# Patient Record
Sex: Female | Born: 1965 | Race: White | Hispanic: No | Marital: Married | Smoking: Former smoker
Health system: Southern US, Community
[De-identification: ages and names within clinical notes are randomized; demographics above are authoritative.]

## PROBLEM LIST (undated history)

## (undated) DIAGNOSIS — G43109 Migraine with aura, not intractable, without status migrainosus: Secondary | ICD-10-CM

## (undated) DIAGNOSIS — N200 Calculus of kidney: Secondary | ICD-10-CM

## (undated) DIAGNOSIS — H269 Unspecified cataract: Secondary | ICD-10-CM

## (undated) DIAGNOSIS — G43009 Migraine without aura, not intractable, without status migrainosus: Secondary | ICD-10-CM

## (undated) DIAGNOSIS — G43909 Migraine, unspecified, not intractable, without status migrainosus: Secondary | ICD-10-CM

## (undated) DIAGNOSIS — D496 Neoplasm of unspecified behavior of brain: Secondary | ICD-10-CM

## (undated) DIAGNOSIS — H9190 Unspecified hearing loss, unspecified ear: Secondary | ICD-10-CM

## (undated) HISTORY — DX: Calculus of kidney: N20.0

## (undated) HISTORY — DX: Unspecified cataract: H26.9

## (undated) HISTORY — DX: Migraine, unspecified, not intractable, without status migrainosus: G43.909

## (undated) HISTORY — DX: Migraine without aura, not intractable, without status migrainosus: G43.009

## (undated) HISTORY — DX: Neoplasm of unspecified behavior of brain: D49.6

## (undated) HISTORY — DX: Migraine with aura, not intractable, without status migrainosus: G43.109

## (undated) HISTORY — PX: OTHER SURGICAL HISTORY: SHX169

## (undated) HISTORY — DX: Unspecified hearing loss, unspecified ear: H91.90

---

## 2001-11-14 HISTORY — PX: ACOUSTIC NEUROMA RESECTION: SHX5713

## 2011-06-21 ENCOUNTER — Other Ambulatory Visit: Payer: Self-pay | Admitting: *Deleted

## 2011-06-21 ENCOUNTER — Other Ambulatory Visit: Payer: Self-pay

## 2011-06-21 ENCOUNTER — Ambulatory Visit
Admission: RE | Admit: 2011-06-21 | Discharge: 2011-06-21 | Disposition: A | Discharge status: Home / Self Care | Payer: BC Managed Care – PPO | Source: Ambulatory Visit | Attending: *Deleted | Admitting: *Deleted

## 2011-06-21 ENCOUNTER — Ambulatory Visit
Admission: RE | Admit: 2011-06-21 | Discharge: 2011-06-21 | Disposition: A | Discharge status: Home / Self Care | Payer: BC Managed Care – PPO | Source: Ambulatory Visit | Attending: Family Medicine | Admitting: Family Medicine

## 2011-06-21 DIAGNOSIS — R87619 Unspecified abnormal cytological findings in specimens from cervix uteri: Secondary | ICD-10-CM

## 2011-07-26 ENCOUNTER — Other Ambulatory Visit: Payer: Self-pay | Admitting: Obstetrics and Gynecology

## 2011-08-05 ENCOUNTER — Ambulatory Visit
Admission: RE | Admit: 2011-08-05 | Discharge: 2011-08-05 | Disposition: A | Discharge status: Home / Self Care | Payer: BC Managed Care – PPO | Source: Ambulatory Visit | Attending: Obstetrics and Gynecology | Admitting: Obstetrics and Gynecology

## 2011-08-05 ENCOUNTER — Other Ambulatory Visit: Payer: Self-pay | Admitting: Obstetrics and Gynecology

## 2011-08-05 DIAGNOSIS — N83209 Unspecified ovarian cyst, unspecified side: Secondary | ICD-10-CM

## 2011-12-03 IMAGING — US US TRANSVAGINAL NON-OB
1 series · 13 of 25 positions shown · non-contrast
Comparison: None.

CLINICAL DATA: Abnormal Pap seen, history of endometriosis

TRANSABDOMINAL ULTRASOUND OF PELVIS
TECHNIQUE: Transabdominal ultrasound examination of the pelvis was
performed including evaluation of the uterus, ovaries, adnexal
regions, and pelvic cul-de-sac.

[Series 1: us transvaginal non-ob · 0.28mm/px · 13 of 52 slices shown]
[im 1/52]
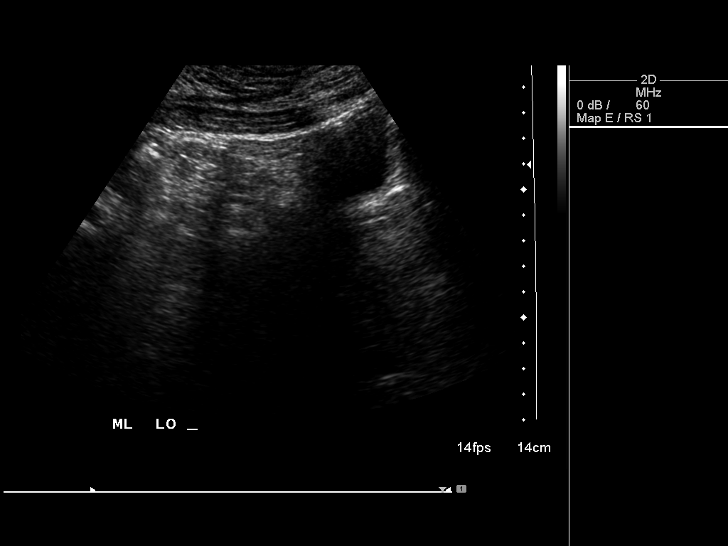
[im 5/52]
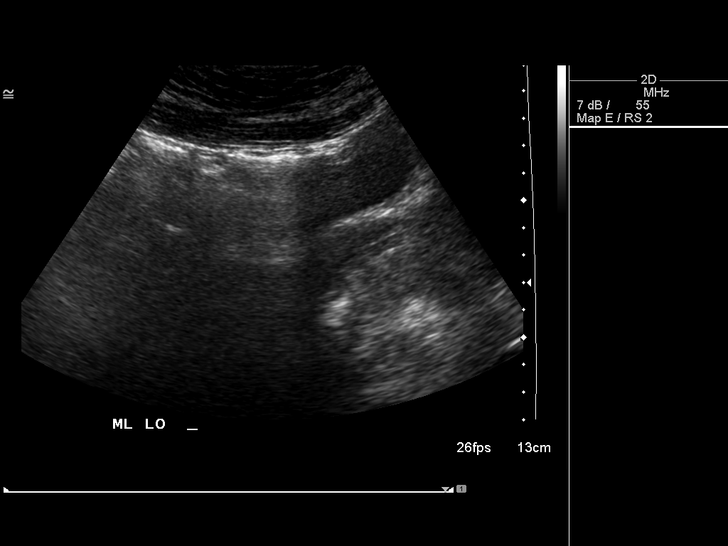
[im 9/52]
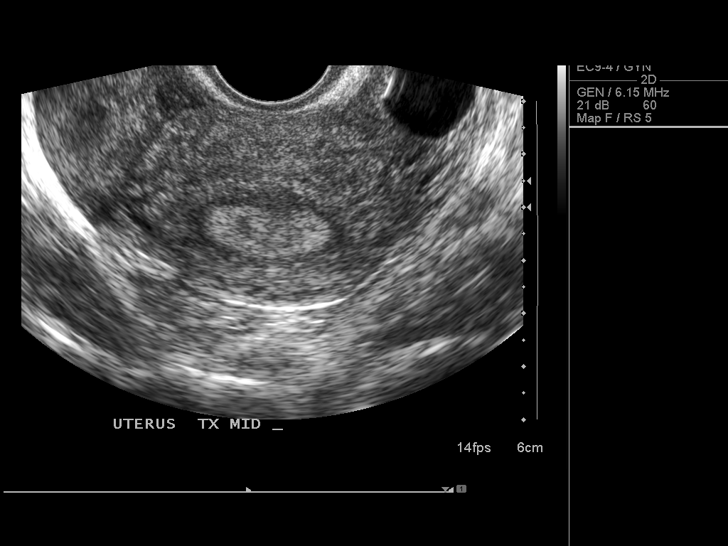
[im 13/52]
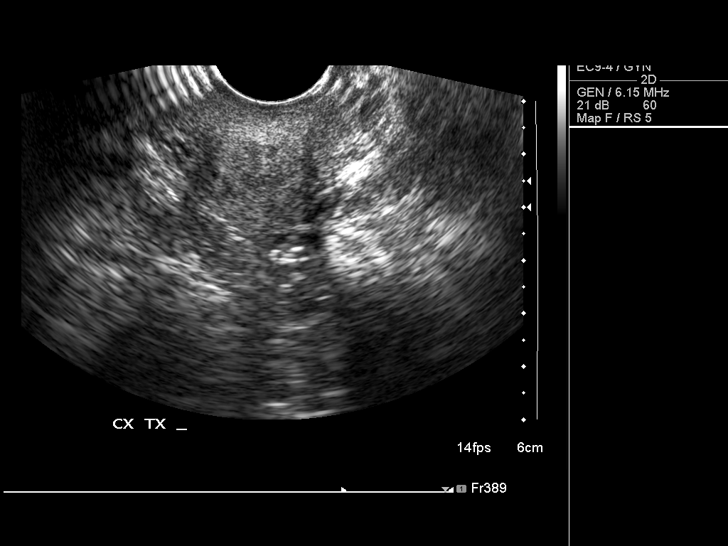
[im 18/52]
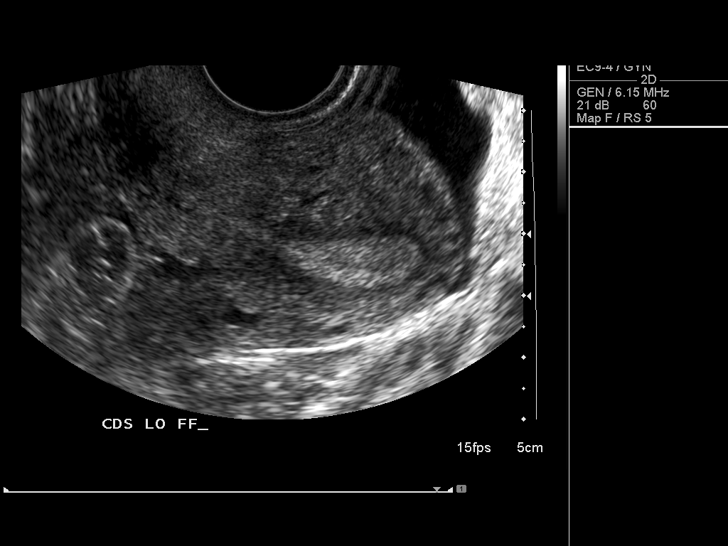
[im 22/52]
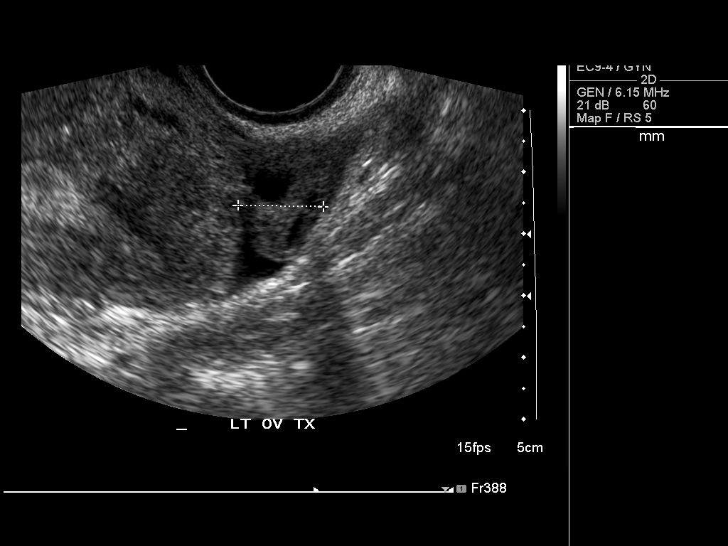
[im 26/52]
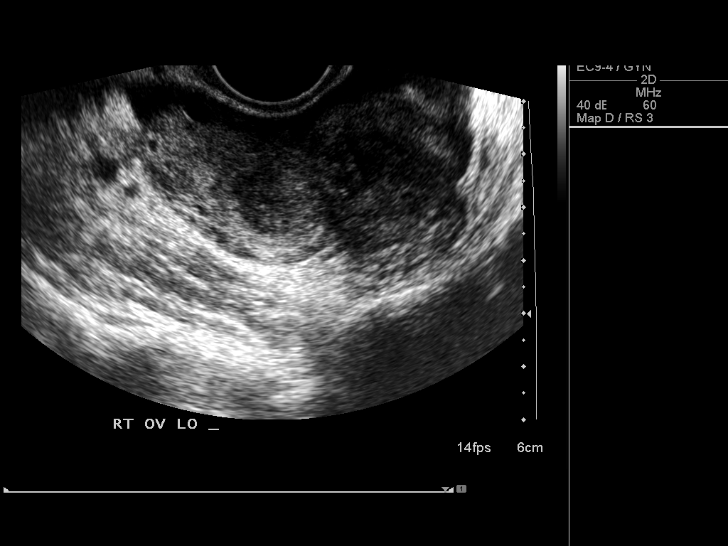
[im 30/52]
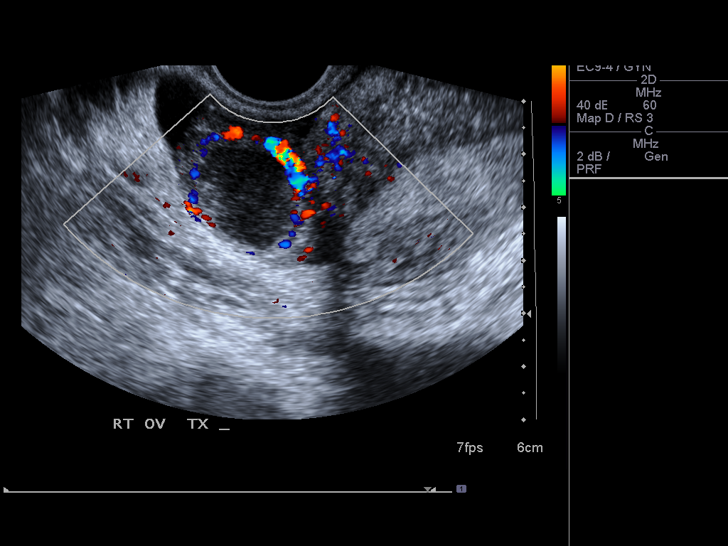
[im 35/52]
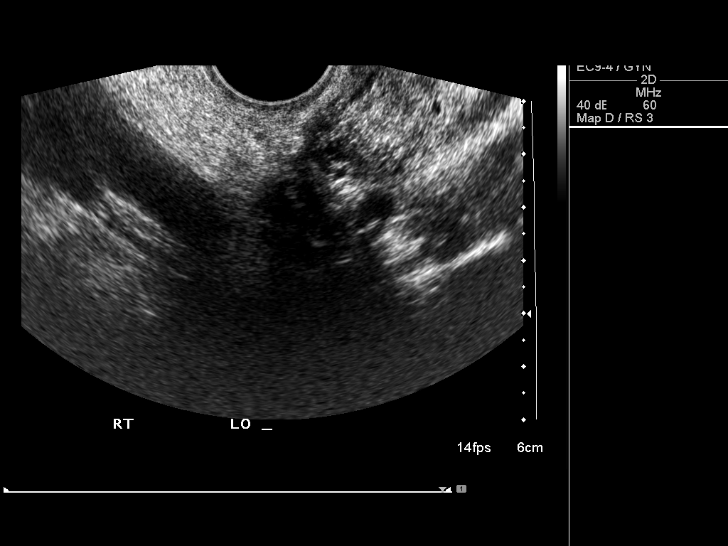
[im 39/52]
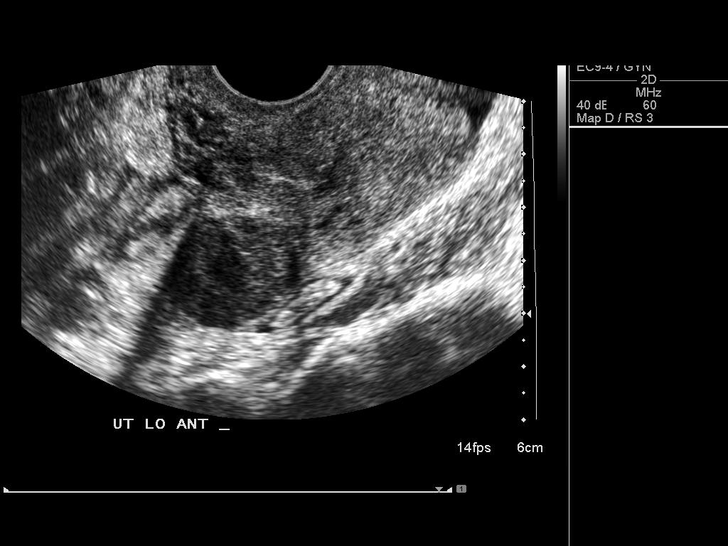
[im 43/52]
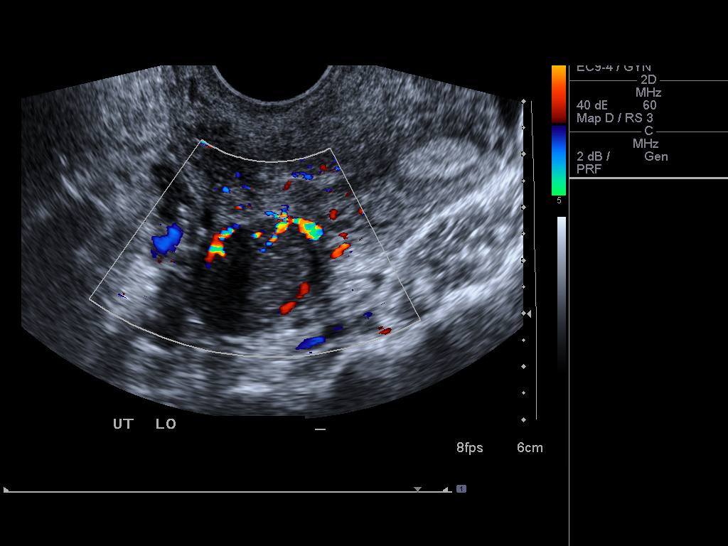
[im 47/52]
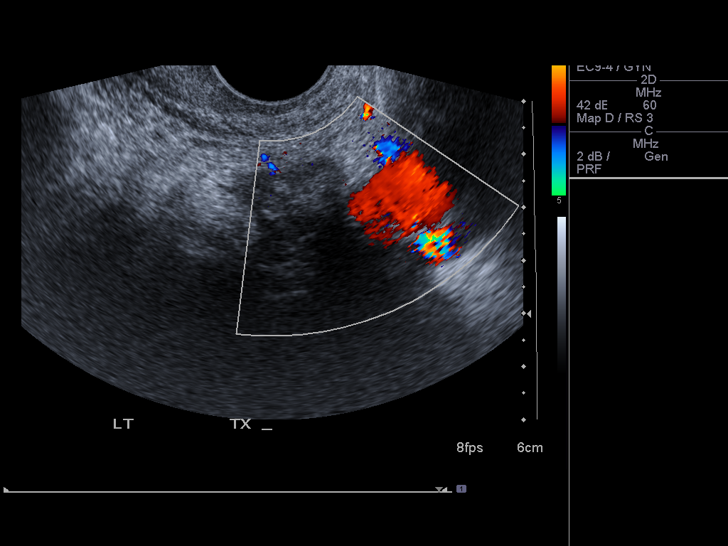
[im 52/52]
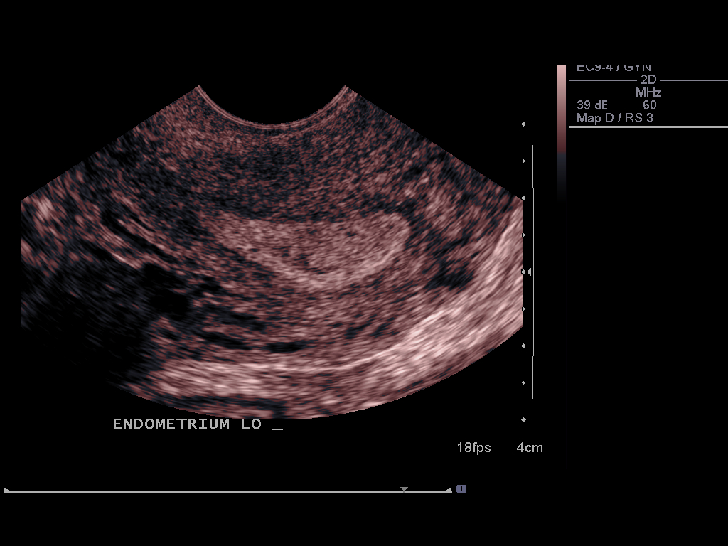

[13 of 25 positions shown; findings below may reference images not displayed]

FINDINGS: Uterus:  The uterus is retroflexed measuring 7.2 x 6.1 x 5.2 cm.
Along the anterior margin of the uterine fundus there is an
exophytic rounded lesion measuring 2.5 x 2.2 x 2.3 cm.  This is
most suggestive of a leiomyoma.

Endometrium: Endometrium is within normal limits for premenopausal
female at 8 mm.

Right ovary: Right ovary measures 4.0 x 2.2 x 2.4 cm.  There is a
round homogeneously hypoechoic lesion in the right ovary measuring
2.2 x 1.9 x 1.8 cm.

Left ovary: Left ovary measures 3.7 x 2.1 by 1.4 cm.  There is an
anechoic 2 cm cyst within the left ovary.

Other Findings:  The small amount free fluid the cul-de-sac which
appears simple.
IMPRESSION: 1 . Serosal uterine fibroid.
2.  Rounded lesion in the left ovary likely represents an
endometrioma.  Recommend follow-up ultrasound in 6 to 12 weeks for
reevaluation.

 3.  Anechoic cyst in left ovary likely represents a functional
ovarian cyst.
4.   Small free fluid within the cul-de-sac may be physiologic.

## 2012-01-17 IMAGING — US US TRANSVAGINAL NON-OB
1 series · 13 of 25 positions shown · non-contrast
Comparison: 06/21/2011

CLINICAL DATA: Right ovarian hemorrhagic lesion

TRANSVAGINAL ULTRASOUND OF PELVIS
DOPPLER ULTRASOUND OF OVARIES
TECHNIQUE: Transvaginal ultrasound examination of the pelvis was
performed including evaluation of the uterus, ovaries, adnexal
regions, and pelvic cul-de-sac. Color and duplex Doppler ultrasound
was utilized to evaluate blood flow to the ovaries.

[Series 1: us transvaginal non-ob · 0.13mm/px · 13 of 57 slices shown]
[im 1/57]
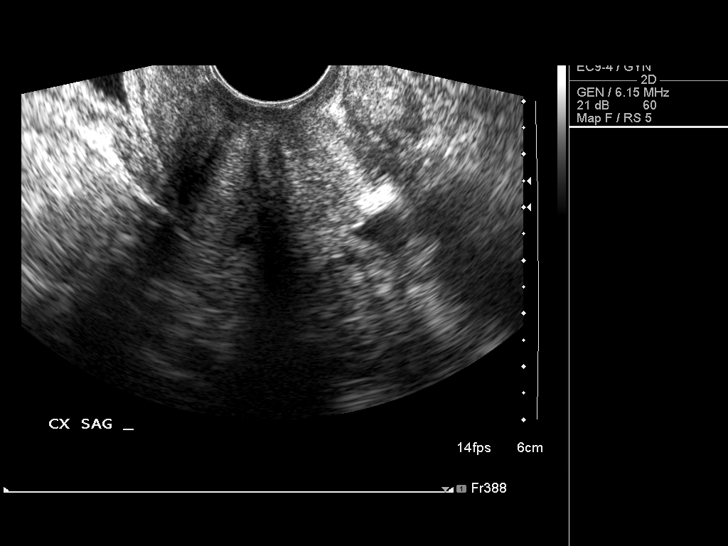
[im 5/57]
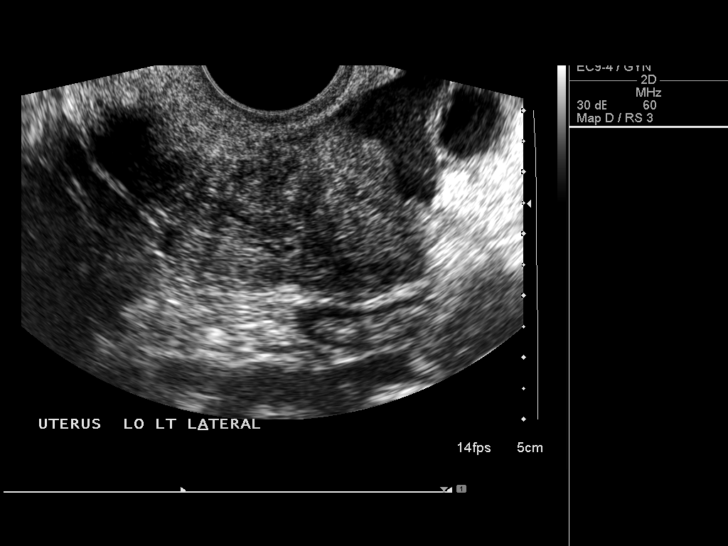
[im 10/57]
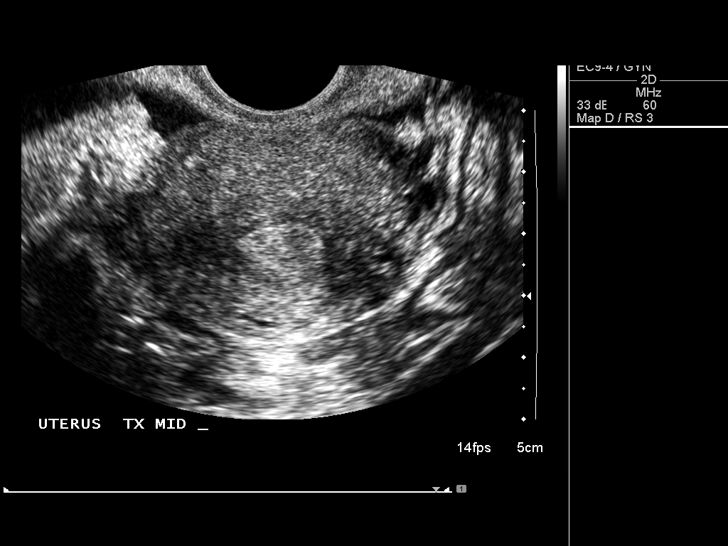
[im 15/57]
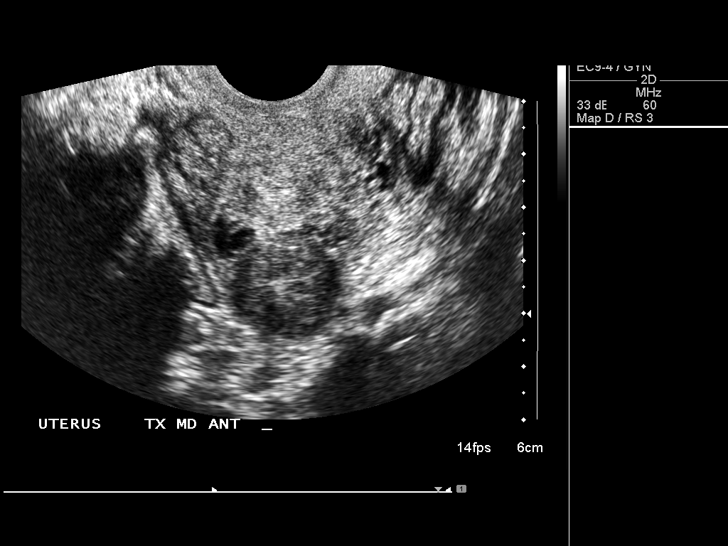
[im 19/57]
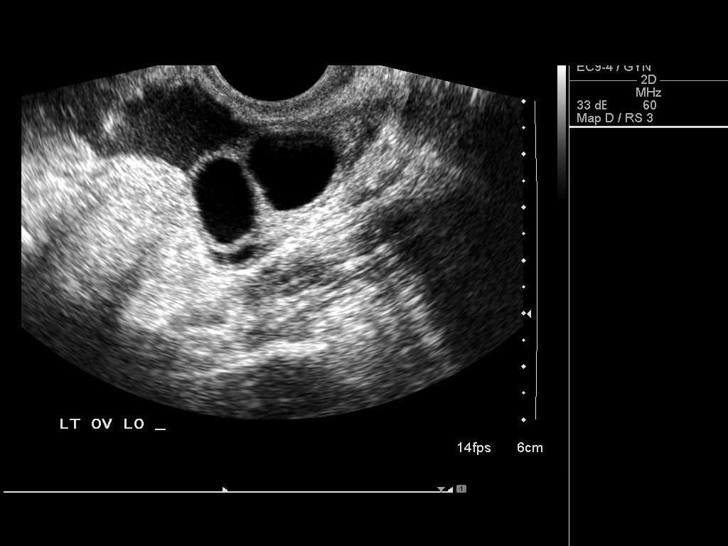
[im 24/57]
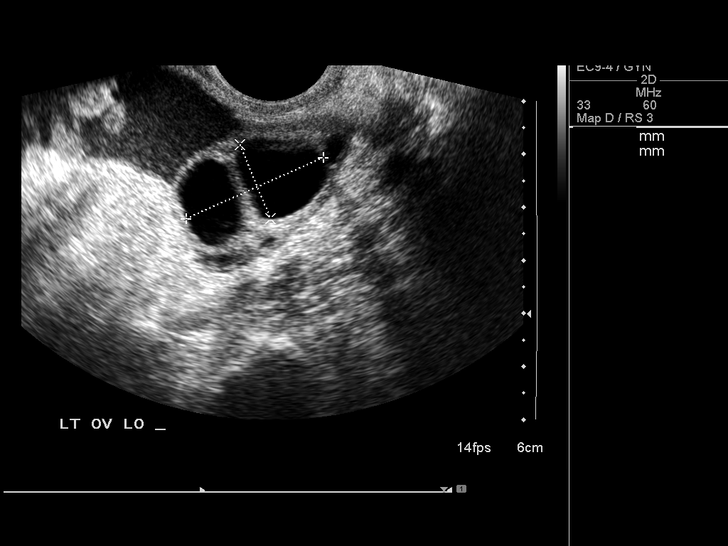
[im 29/57]
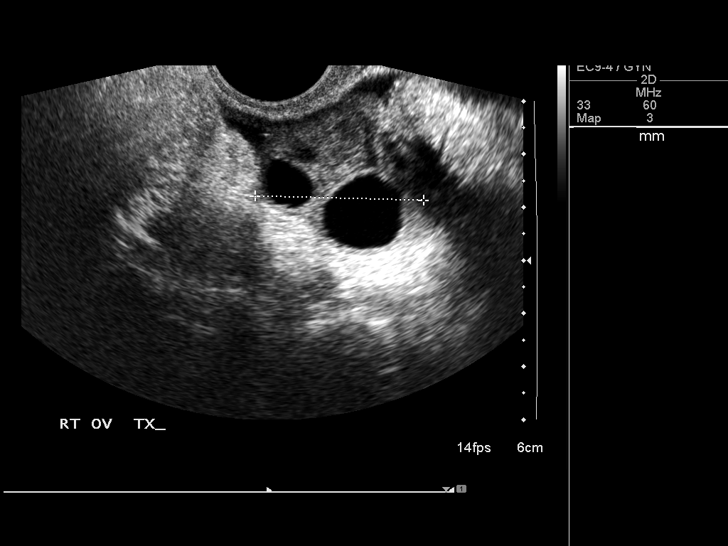
[im 33/57]
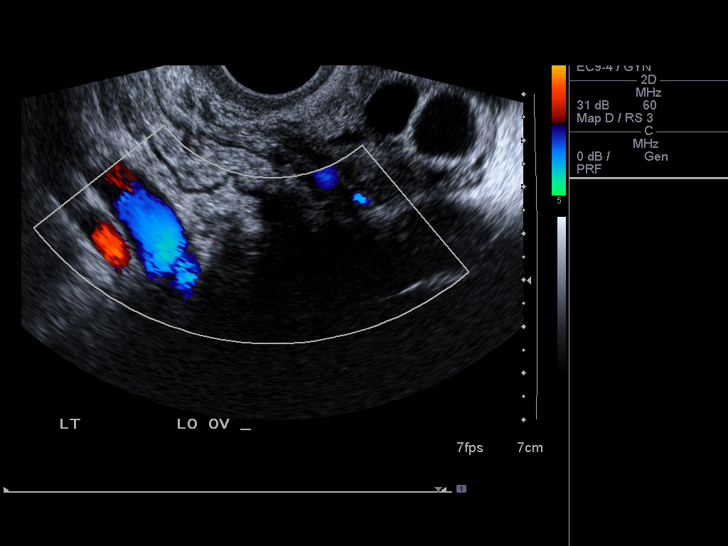
[im 38/57]
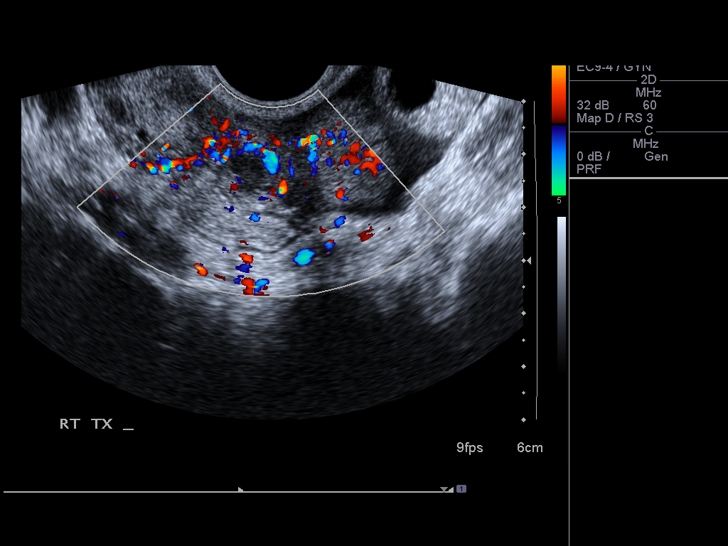
[im 43/57]
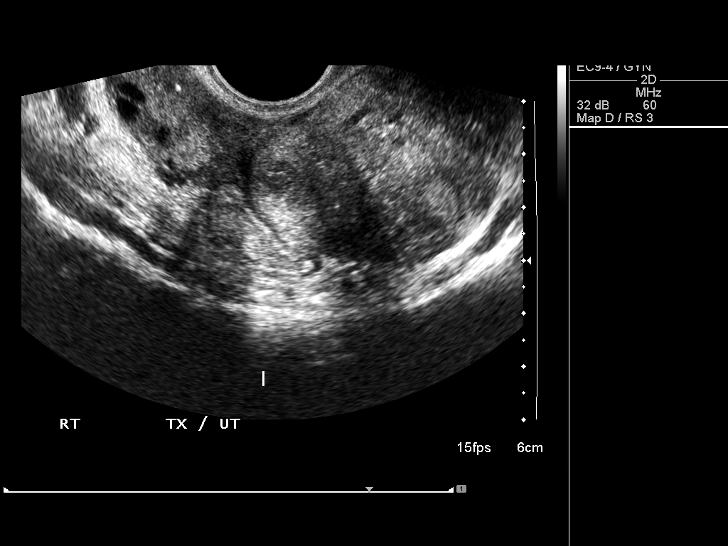
[im 47/57]
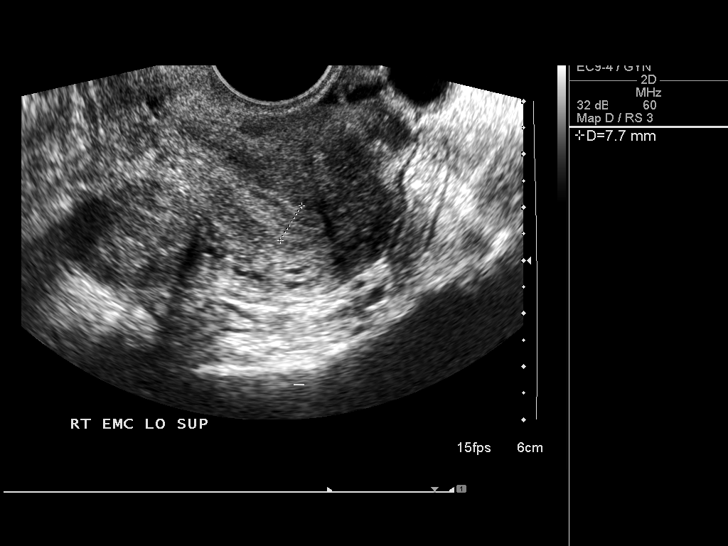
[im 52/57]
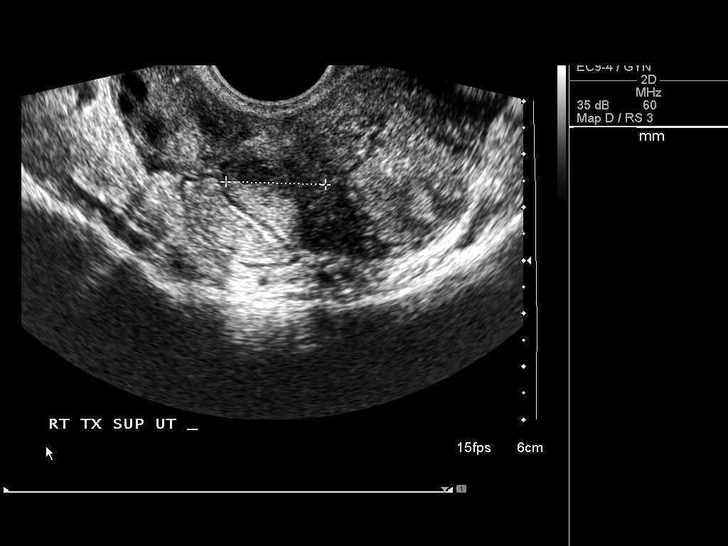
[im 57/57]
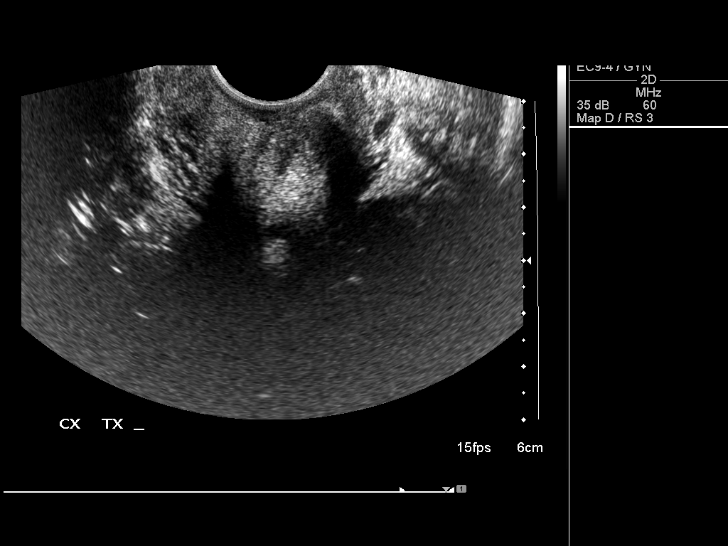

[13 of 25 positions shown; findings below may reference images not displayed]

FINDINGS: Uterus:  7.2 x 5.5 x 4.6 centimeter.  2.5 cm anterior subserosal
fibroid is again identified.

Endometrium: 6-7 mm in thickness.

Right ovary: 3.6 x 1.9 x 3.2 cm.  Several follicles are seen within
the parenchyma there is some adjacent fluid containing low level
echoes.

Left ovary: 3.6 x 2.2 x 2.4 cm.  Follicles measure up to 17 mm in
diameter.

Other Findings:  There is some complex fluid in the peritoneal
cavity to a small degree.

Complex area is seen along the right uterine fundus.  This could be
related to bowel but on some images appears to be a thick-walled
cystic lesion.
IMPRESSION: Both ovaries are identified on today's study without
an ovarian mass.

Ill-defined complex process along the right uterine fundus of
indeterminate etiology.  There may be some small bowel loops in
this area complicating evaluation.  The patient does have some
complex free fluid in the pelvis.  On some of the images, the
process in the right pelvis appears to contain a thick-walled cyst,
and correlation for pregnancy is recommended to exclude an ectopic
pregnancy.  Repeat ultrasound could be used to assess evolution of
this finding or MRI of the pelvis without and with contrast may
prove helpful to further evaluate.

.

## 2012-07-13 ENCOUNTER — Other Ambulatory Visit: Payer: Self-pay | Admitting: *Deleted

## 2012-07-13 DIAGNOSIS — R87619 Unspecified abnormal cytological findings in specimens from cervix uteri: Secondary | ICD-10-CM

## 2012-07-17 ENCOUNTER — Ambulatory Visit (INDEPENDENT_AMBULATORY_CARE_PROVIDER_SITE_OTHER): Payer: BC Managed Care – PPO

## 2012-07-17 ENCOUNTER — Other Ambulatory Visit: Payer: Self-pay | Admitting: Obstetrics and Gynecology

## 2012-07-17 DIAGNOSIS — D252 Subserosal leiomyoma of uterus: Secondary | ICD-10-CM

## 2012-07-17 DIAGNOSIS — R87619 Unspecified abnormal cytological findings in specimens from cervix uteri: Secondary | ICD-10-CM

## 2012-12-29 IMAGING — US US PELVIS COMPLETE
1 series · 13 of 25 positions shown · non-contrast
Comparison: 08/05/2011 and prior MRI report performed at [REDACTED] Department on 08/08/2011

CLINICAL DATA: Abnormal Pap smear. LMP 07/12/2012.



[Series 1: us pelvis complete · 0.28mm/px · 13 of 46 slices shown]
[im 1/46]
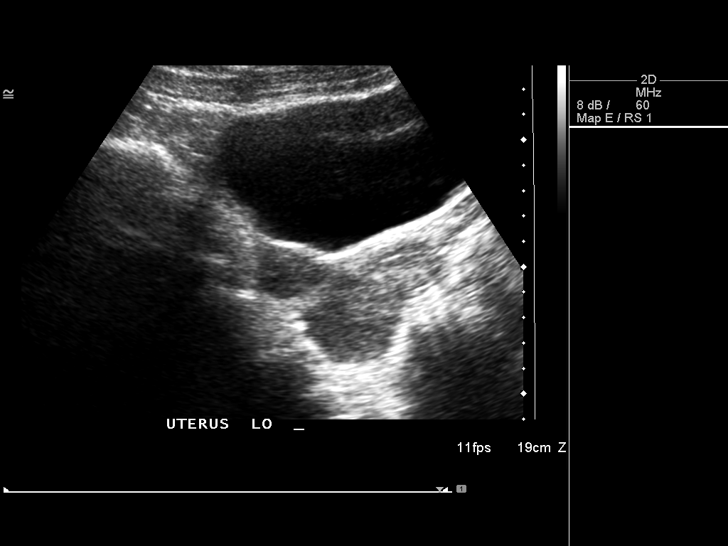
[im 4/46]
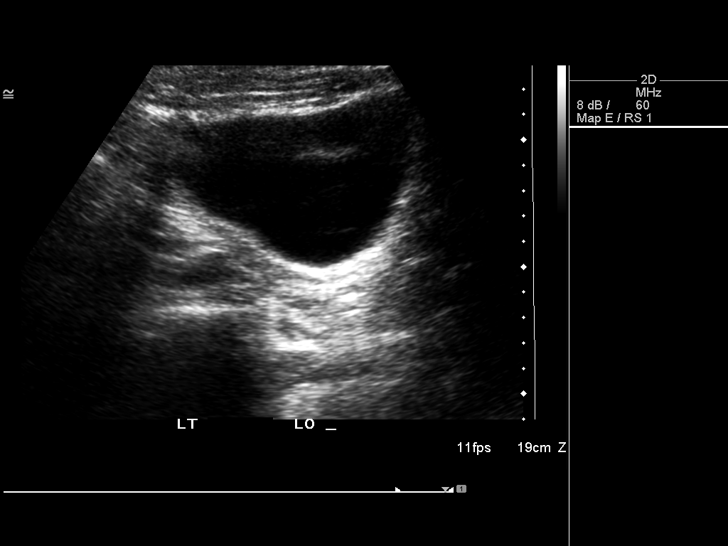
[im 8/46]
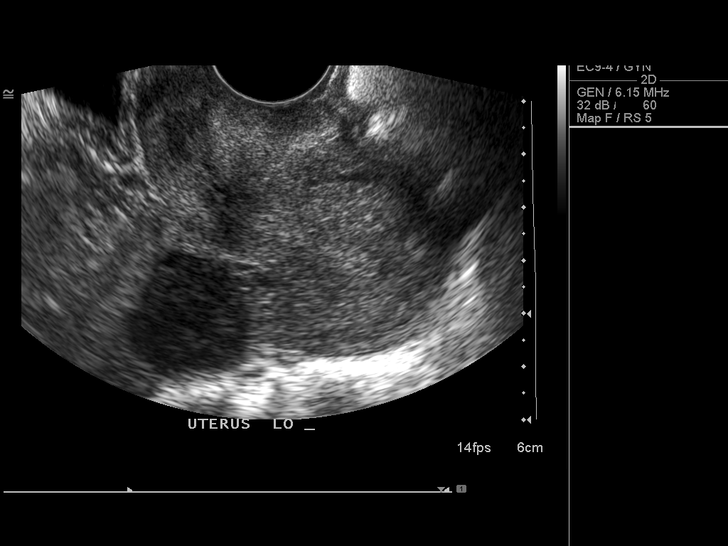
[im 12/46]
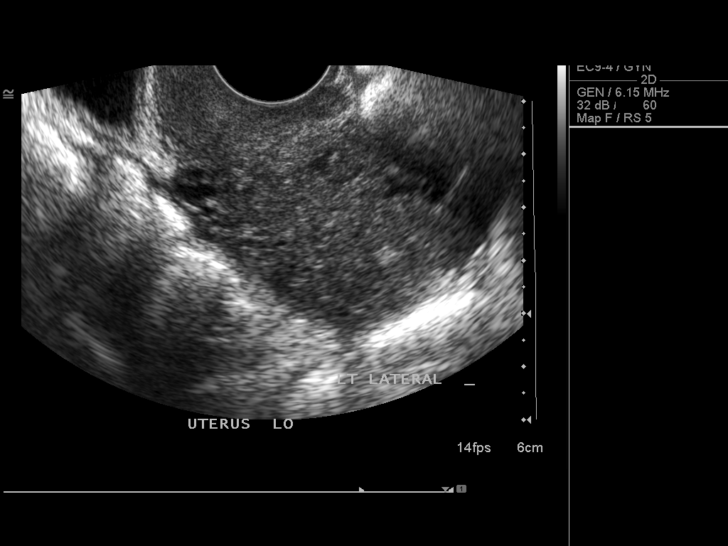
[im 16/46]
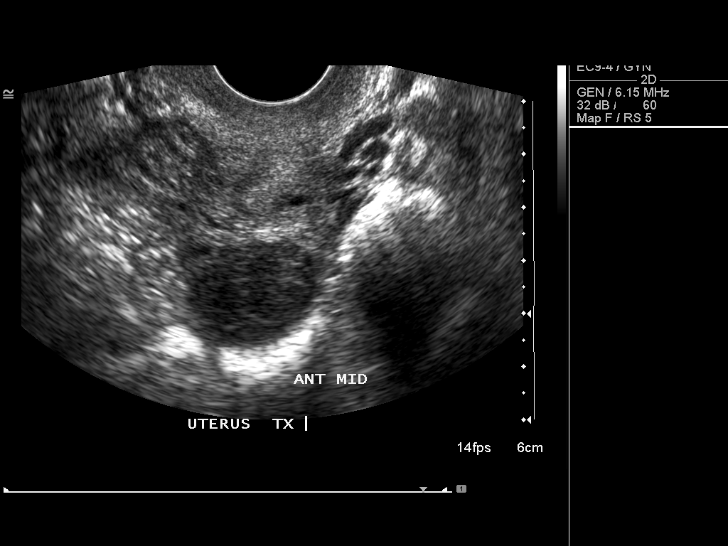
[im 19/46]
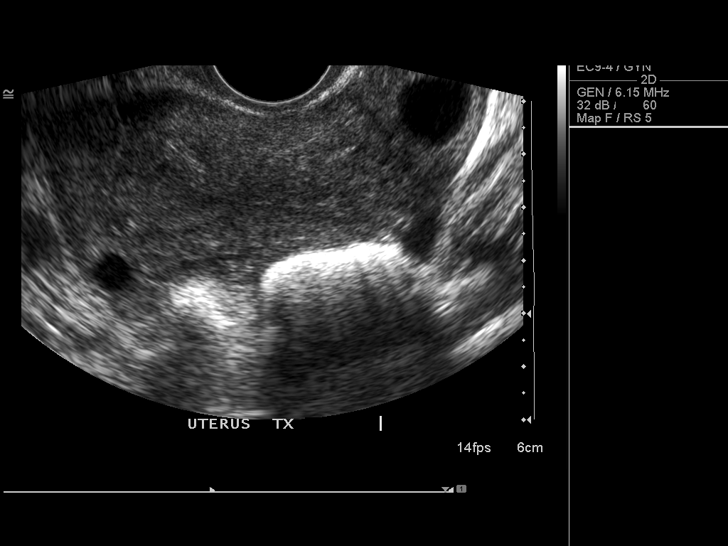
[im 23/46]
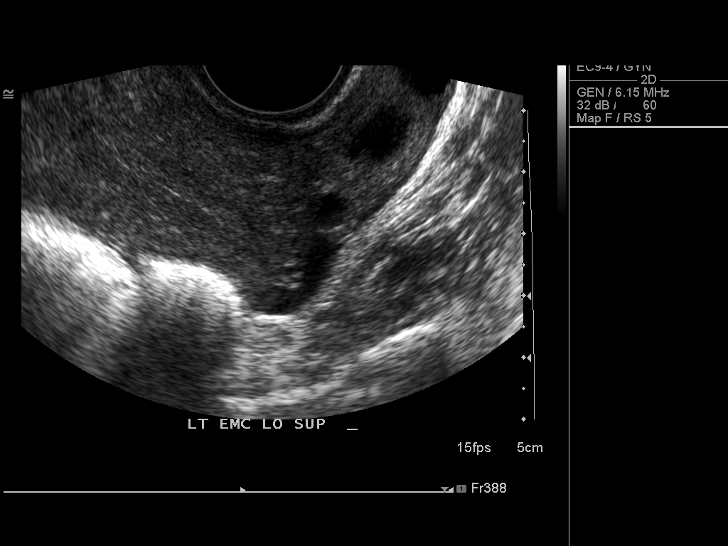
[im 27/46]
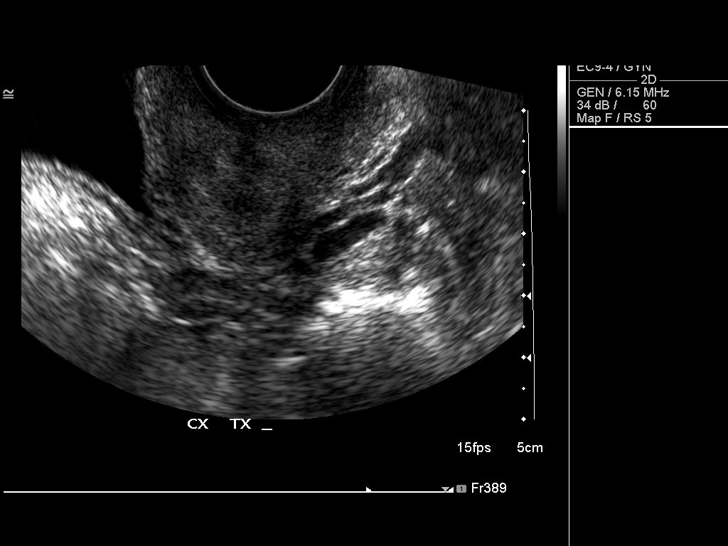
[im 31/46]
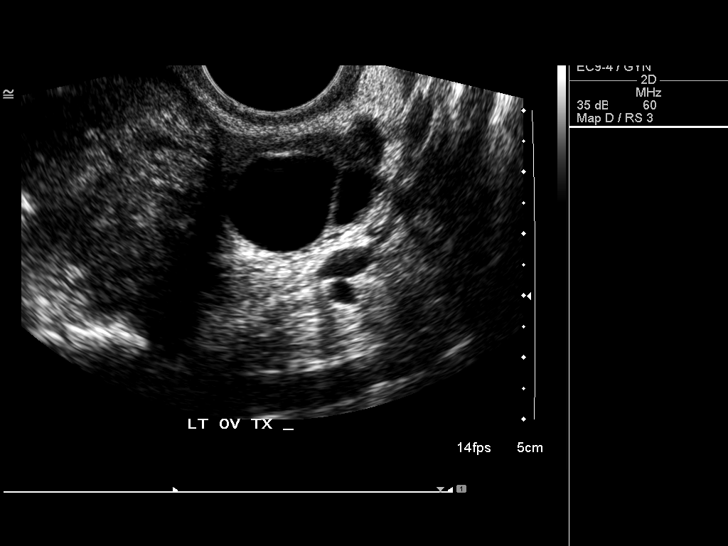
[im 34/46]
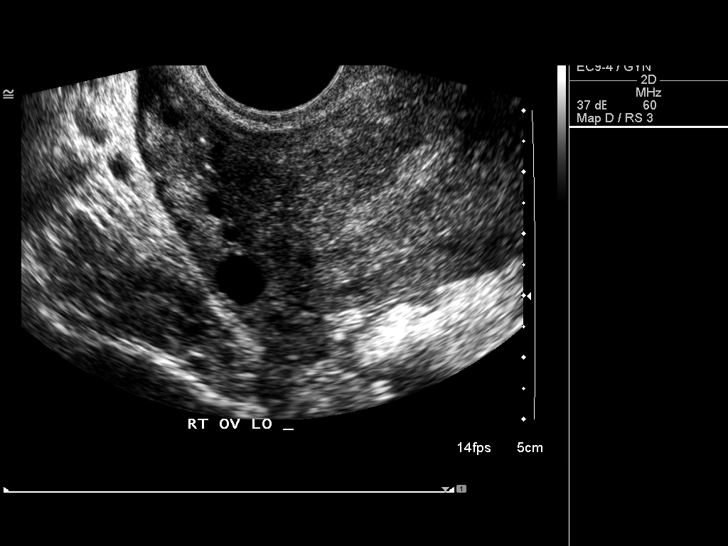
[im 38/46]
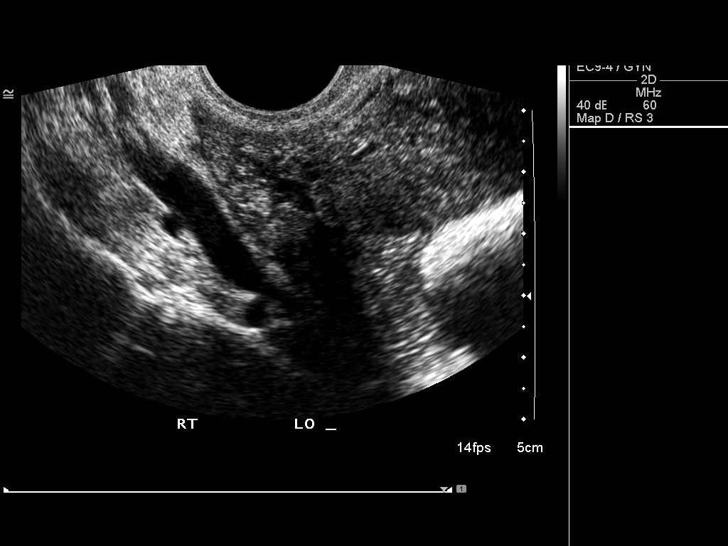
[im 42/46]
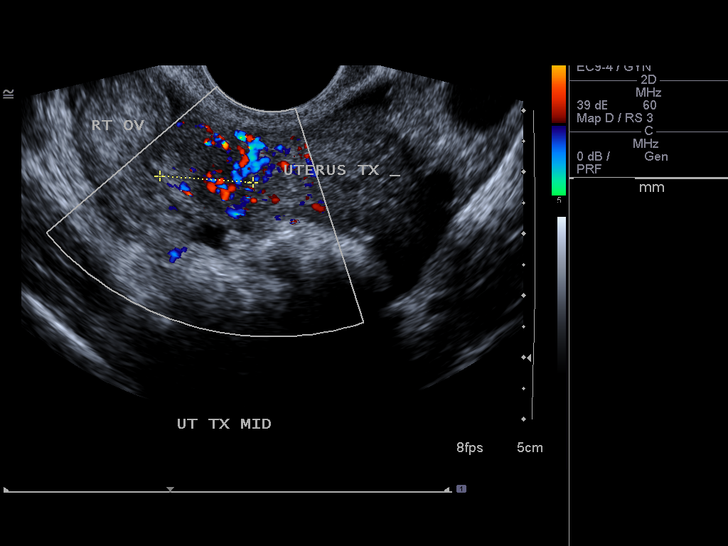
[im 46/46]
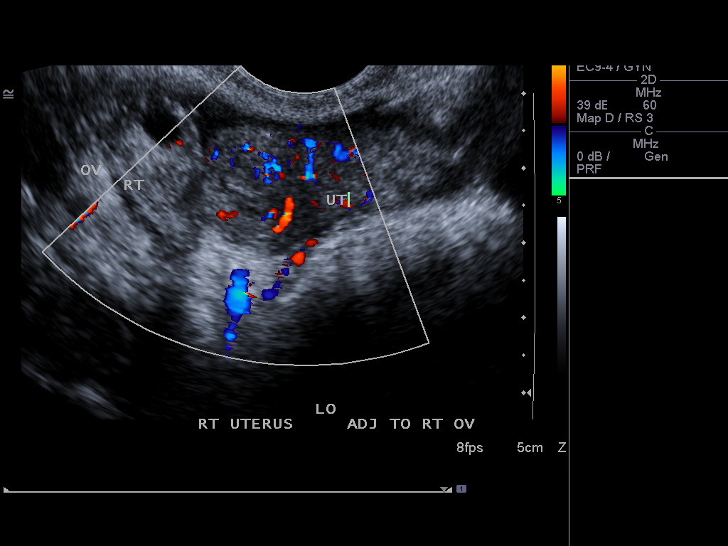

[13 of 25 positions shown; findings below may reference images not displayed]

FINDINGS: Uterus:  Is anteverted and anteflexed and demonstrates a sagittal
length of 6.8 cm, depth of 5.8 cm and width of 5.9 cm.  Two focal
fibroids are identified both with a partial subserosal component
one in the anterior midbody measuring 2.4 x 2.4 x 2.5 cm and one in
the right cornual region measuring 2.2 x 1.7 x 1.5 cm.  The cervix
has a normal appearance as does the endocervical canal
sonographically.

Endometrium:  Is thin and echogenic with a width of 6.2 mm.  No
areas of focal thickening or heterogeneity are seen and this would
correlate with a postsecretory endometrial stripe and correspond
with the provided LMP of 07/12/2012.

Right ovary: Has a normal appearance measuring 4.3 x 1.2 x 2.5 cm

Left ovary:  Has a normal appearance measuring 3.1 x 2.4 x 2.8 cm

Other:  No pelvic fluid or separate adnexal masses are seen..
IMPRESSION: Fibroid uterus with fibroid sizes and locations as noted above.

Normal endometrial lining and cervix.  Normal ovaries

.

## 2016-03-28 ENCOUNTER — Other Ambulatory Visit (HOSPITAL_COMMUNITY): Payer: Self-pay

## 2016-03-28 DIAGNOSIS — Z419 Encounter for procedure for purposes other than remedying health state, unspecified: Secondary | ICD-10-CM

## 2016-03-29 ENCOUNTER — Other Ambulatory Visit (HOSPITAL_COMMUNITY): Payer: Self-pay

## 2016-04-09 ENCOUNTER — Ambulatory Visit (HOSPITAL_COMMUNITY)
Admission: RE | Admit: 2016-04-09 | Discharge: 2016-04-09 | Disposition: A | Discharge status: Home / Self Care | Payer: BLUE CROSS/BLUE SHIELD | Source: Ambulatory Visit | Attending: Family Medicine | Admitting: Family Medicine

## 2016-04-09 ENCOUNTER — Other Ambulatory Visit: Payer: Self-pay

## 2016-04-09 DIAGNOSIS — Z01818 Encounter for other preprocedural examination: Secondary | ICD-10-CM | POA: Diagnosis present

## 2016-08-19 DIAGNOSIS — M9901 Segmental and somatic dysfunction of cervical region: Secondary | ICD-10-CM | POA: Diagnosis not present

## 2016-08-19 DIAGNOSIS — M9902 Segmental and somatic dysfunction of thoracic region: Secondary | ICD-10-CM | POA: Diagnosis not present

## 2016-08-19 DIAGNOSIS — M9903 Segmental and somatic dysfunction of lumbar region: Secondary | ICD-10-CM | POA: Diagnosis not present

## 2016-08-19 DIAGNOSIS — M531 Cervicobrachial syndrome: Secondary | ICD-10-CM | POA: Diagnosis not present

## 2016-09-13 DIAGNOSIS — M531 Cervicobrachial syndrome: Secondary | ICD-10-CM | POA: Diagnosis not present

## 2016-09-13 DIAGNOSIS — M9901 Segmental and somatic dysfunction of cervical region: Secondary | ICD-10-CM | POA: Diagnosis not present

## 2016-09-13 DIAGNOSIS — M9903 Segmental and somatic dysfunction of lumbar region: Secondary | ICD-10-CM | POA: Diagnosis not present

## 2016-09-13 DIAGNOSIS — M9902 Segmental and somatic dysfunction of thoracic region: Secondary | ICD-10-CM | POA: Diagnosis not present

## 2016-09-16 DIAGNOSIS — Z1211 Encounter for screening for malignant neoplasm of colon: Secondary | ICD-10-CM | POA: Diagnosis not present

## 2016-12-26 DIAGNOSIS — J3489 Other specified disorders of nose and nasal sinuses: Secondary | ICD-10-CM | POA: Diagnosis not present

## 2016-12-31 DIAGNOSIS — B370 Candidal stomatitis: Secondary | ICD-10-CM | POA: Diagnosis not present

## 2017-04-03 DIAGNOSIS — M9902 Segmental and somatic dysfunction of thoracic region: Secondary | ICD-10-CM | POA: Diagnosis not present

## 2017-04-03 DIAGNOSIS — M531 Cervicobrachial syndrome: Secondary | ICD-10-CM | POA: Diagnosis not present

## 2017-04-03 DIAGNOSIS — M9903 Segmental and somatic dysfunction of lumbar region: Secondary | ICD-10-CM | POA: Diagnosis not present

## 2017-04-03 DIAGNOSIS — M9901 Segmental and somatic dysfunction of cervical region: Secondary | ICD-10-CM | POA: Diagnosis not present

## 2017-04-18 ENCOUNTER — Other Ambulatory Visit (HOSPITAL_COMMUNITY): Payer: Self-pay | Admitting: Plastic and Reconstructive Surgery

## 2017-04-18 DIAGNOSIS — Z01818 Encounter for other preprocedural examination: Secondary | ICD-10-CM

## 2017-04-24 ENCOUNTER — Ambulatory Visit (HOSPITAL_COMMUNITY)
Admission: RE | Admit: 2017-04-24 | Discharge: 2017-04-24 | Disposition: A | Discharge status: Home / Self Care | Payer: BLUE CROSS/BLUE SHIELD | Source: Ambulatory Visit | Attending: Plastic and Reconstructive Surgery | Admitting: Plastic and Reconstructive Surgery

## 2017-04-24 DIAGNOSIS — Z01818 Encounter for other preprocedural examination: Secondary | ICD-10-CM | POA: Diagnosis not present

## 2017-04-25 DIAGNOSIS — Z01812 Encounter for preprocedural laboratory examination: Secondary | ICD-10-CM | POA: Diagnosis not present

## 2017-05-05 DIAGNOSIS — M9902 Segmental and somatic dysfunction of thoracic region: Secondary | ICD-10-CM | POA: Diagnosis not present

## 2017-05-05 DIAGNOSIS — M531 Cervicobrachial syndrome: Secondary | ICD-10-CM | POA: Diagnosis not present

## 2017-05-05 DIAGNOSIS — M9903 Segmental and somatic dysfunction of lumbar region: Secondary | ICD-10-CM | POA: Diagnosis not present

## 2017-05-05 DIAGNOSIS — M9901 Segmental and somatic dysfunction of cervical region: Secondary | ICD-10-CM | POA: Diagnosis not present

## 2017-05-05 DIAGNOSIS — Z01812 Encounter for preprocedural laboratory examination: Secondary | ICD-10-CM | POA: Diagnosis not present

## 2017-07-03 DIAGNOSIS — Z01419 Encounter for gynecological examination (general) (routine) without abnormal findings: Secondary | ICD-10-CM | POA: Diagnosis not present

## 2017-07-03 DIAGNOSIS — Z1231 Encounter for screening mammogram for malignant neoplasm of breast: Secondary | ICD-10-CM | POA: Diagnosis not present

## 2017-07-03 DIAGNOSIS — Z130 Encounter for screening for diseases of the blood and blood-forming organs and certain disorders involving the immune mechanism: Secondary | ICD-10-CM | POA: Diagnosis not present

## 2017-07-03 DIAGNOSIS — N393 Stress incontinence (female) (male): Secondary | ICD-10-CM | POA: Diagnosis not present

## 2017-07-03 DIAGNOSIS — Z1389 Encounter for screening for other disorder: Secondary | ICD-10-CM | POA: Diagnosis not present

## 2017-07-03 DIAGNOSIS — Z6827 Body mass index (BMI) 27.0-27.9, adult: Secondary | ICD-10-CM | POA: Diagnosis not present

## 2017-09-18 DIAGNOSIS — M9901 Segmental and somatic dysfunction of cervical region: Secondary | ICD-10-CM | POA: Diagnosis not present

## 2017-09-18 DIAGNOSIS — M531 Cervicobrachial syndrome: Secondary | ICD-10-CM | POA: Diagnosis not present

## 2017-09-18 DIAGNOSIS — M9903 Segmental and somatic dysfunction of lumbar region: Secondary | ICD-10-CM | POA: Diagnosis not present

## 2017-09-18 DIAGNOSIS — M9902 Segmental and somatic dysfunction of thoracic region: Secondary | ICD-10-CM | POA: Diagnosis not present

## 2017-10-13 DIAGNOSIS — M9903 Segmental and somatic dysfunction of lumbar region: Secondary | ICD-10-CM | POA: Diagnosis not present

## 2017-10-13 DIAGNOSIS — M9901 Segmental and somatic dysfunction of cervical region: Secondary | ICD-10-CM | POA: Diagnosis not present

## 2017-10-13 DIAGNOSIS — M531 Cervicobrachial syndrome: Secondary | ICD-10-CM | POA: Diagnosis not present

## 2017-10-13 DIAGNOSIS — M9902 Segmental and somatic dysfunction of thoracic region: Secondary | ICD-10-CM | POA: Diagnosis not present

## 2017-11-07 DIAGNOSIS — F419 Anxiety disorder, unspecified: Secondary | ICD-10-CM | POA: Diagnosis not present

## 2017-11-07 DIAGNOSIS — K219 Gastro-esophageal reflux disease without esophagitis: Secondary | ICD-10-CM | POA: Diagnosis not present

## 2017-11-07 DIAGNOSIS — Z713 Dietary counseling and surveillance: Secondary | ICD-10-CM | POA: Diagnosis not present

## 2017-11-07 DIAGNOSIS — Z7689 Persons encountering health services in other specified circumstances: Secondary | ICD-10-CM | POA: Diagnosis not present

## 2017-12-05 DIAGNOSIS — Z713 Dietary counseling and surveillance: Secondary | ICD-10-CM | POA: Diagnosis not present

## 2017-12-05 DIAGNOSIS — E663 Overweight: Secondary | ICD-10-CM | POA: Diagnosis not present

## 2017-12-05 DIAGNOSIS — Z6825 Body mass index (BMI) 25.0-25.9, adult: Secondary | ICD-10-CM | POA: Diagnosis not present

## 2018-03-03 DIAGNOSIS — Z6823 Body mass index (BMI) 23.0-23.9, adult: Secondary | ICD-10-CM | POA: Diagnosis not present

## 2018-03-03 DIAGNOSIS — Z7689 Persons encountering health services in other specified circumstances: Secondary | ICD-10-CM | POA: Diagnosis not present

## 2018-03-03 DIAGNOSIS — Z713 Dietary counseling and surveillance: Secondary | ICD-10-CM | POA: Diagnosis not present

## 2018-04-02 DIAGNOSIS — M9901 Segmental and somatic dysfunction of cervical region: Secondary | ICD-10-CM | POA: Diagnosis not present

## 2018-04-02 DIAGNOSIS — M9903 Segmental and somatic dysfunction of lumbar region: Secondary | ICD-10-CM | POA: Diagnosis not present

## 2018-04-02 DIAGNOSIS — M531 Cervicobrachial syndrome: Secondary | ICD-10-CM | POA: Diagnosis not present

## 2018-04-02 DIAGNOSIS — M9902 Segmental and somatic dysfunction of thoracic region: Secondary | ICD-10-CM | POA: Diagnosis not present

## 2018-08-04 DIAGNOSIS — Z23 Encounter for immunization: Secondary | ICD-10-CM | POA: Diagnosis not present

## 2018-10-12 DIAGNOSIS — M9901 Segmental and somatic dysfunction of cervical region: Secondary | ICD-10-CM | POA: Diagnosis not present

## 2018-10-12 DIAGNOSIS — M531 Cervicobrachial syndrome: Secondary | ICD-10-CM | POA: Diagnosis not present

## 2018-10-12 DIAGNOSIS — M9902 Segmental and somatic dysfunction of thoracic region: Secondary | ICD-10-CM | POA: Diagnosis not present

## 2018-10-12 DIAGNOSIS — M9903 Segmental and somatic dysfunction of lumbar region: Secondary | ICD-10-CM | POA: Diagnosis not present

## 2018-10-18 DIAGNOSIS — N3000 Acute cystitis without hematuria: Secondary | ICD-10-CM | POA: Diagnosis not present

## 2018-10-23 DIAGNOSIS — M9902 Segmental and somatic dysfunction of thoracic region: Secondary | ICD-10-CM | POA: Diagnosis not present

## 2018-10-23 DIAGNOSIS — M9903 Segmental and somatic dysfunction of lumbar region: Secondary | ICD-10-CM | POA: Diagnosis not present

## 2018-10-23 DIAGNOSIS — M531 Cervicobrachial syndrome: Secondary | ICD-10-CM | POA: Diagnosis not present

## 2018-10-23 DIAGNOSIS — M9901 Segmental and somatic dysfunction of cervical region: Secondary | ICD-10-CM | POA: Diagnosis not present

## 2018-11-09 DIAGNOSIS — M531 Cervicobrachial syndrome: Secondary | ICD-10-CM | POA: Diagnosis not present

## 2018-11-09 DIAGNOSIS — M9902 Segmental and somatic dysfunction of thoracic region: Secondary | ICD-10-CM | POA: Diagnosis not present

## 2018-11-09 DIAGNOSIS — M9901 Segmental and somatic dysfunction of cervical region: Secondary | ICD-10-CM | POA: Diagnosis not present

## 2018-11-09 DIAGNOSIS — M9903 Segmental and somatic dysfunction of lumbar region: Secondary | ICD-10-CM | POA: Diagnosis not present

## 2018-11-12 DIAGNOSIS — M9902 Segmental and somatic dysfunction of thoracic region: Secondary | ICD-10-CM | POA: Diagnosis not present

## 2018-11-12 DIAGNOSIS — M9903 Segmental and somatic dysfunction of lumbar region: Secondary | ICD-10-CM | POA: Diagnosis not present

## 2018-11-12 DIAGNOSIS — M531 Cervicobrachial syndrome: Secondary | ICD-10-CM | POA: Diagnosis not present

## 2018-11-12 DIAGNOSIS — M9901 Segmental and somatic dysfunction of cervical region: Secondary | ICD-10-CM | POA: Diagnosis not present

## 2018-11-18 DIAGNOSIS — Z131 Encounter for screening for diabetes mellitus: Secondary | ICD-10-CM | POA: Diagnosis not present

## 2018-11-18 DIAGNOSIS — M531 Cervicobrachial syndrome: Secondary | ICD-10-CM | POA: Diagnosis not present

## 2018-11-18 DIAGNOSIS — M9902 Segmental and somatic dysfunction of thoracic region: Secondary | ICD-10-CM | POA: Diagnosis not present

## 2018-11-18 DIAGNOSIS — Z23 Encounter for immunization: Secondary | ICD-10-CM | POA: Diagnosis not present

## 2018-11-18 DIAGNOSIS — Z0000 Encounter for general adult medical examination without abnormal findings: Secondary | ICD-10-CM | POA: Diagnosis not present

## 2018-11-18 DIAGNOSIS — M9901 Segmental and somatic dysfunction of cervical region: Secondary | ICD-10-CM | POA: Diagnosis not present

## 2018-11-18 DIAGNOSIS — Z13220 Encounter for screening for lipoid disorders: Secondary | ICD-10-CM | POA: Diagnosis not present

## 2018-11-18 DIAGNOSIS — M9903 Segmental and somatic dysfunction of lumbar region: Secondary | ICD-10-CM | POA: Diagnosis not present

## 2018-11-25 DIAGNOSIS — M9903 Segmental and somatic dysfunction of lumbar region: Secondary | ICD-10-CM | POA: Diagnosis not present

## 2018-11-25 DIAGNOSIS — M531 Cervicobrachial syndrome: Secondary | ICD-10-CM | POA: Diagnosis not present

## 2018-11-25 DIAGNOSIS — M9901 Segmental and somatic dysfunction of cervical region: Secondary | ICD-10-CM | POA: Diagnosis not present

## 2018-11-25 DIAGNOSIS — M9902 Segmental and somatic dysfunction of thoracic region: Secondary | ICD-10-CM | POA: Diagnosis not present

## 2018-12-01 DIAGNOSIS — M25551 Pain in right hip: Secondary | ICD-10-CM | POA: Diagnosis not present

## 2018-12-01 DIAGNOSIS — M25552 Pain in left hip: Secondary | ICD-10-CM | POA: Diagnosis not present

## 2018-12-01 DIAGNOSIS — M5106 Intervertebral disc disorders with myelopathy, lumbar region: Secondary | ICD-10-CM | POA: Diagnosis not present

## 2018-12-05 DIAGNOSIS — M545 Low back pain: Secondary | ICD-10-CM | POA: Diagnosis not present

## 2018-12-08 DIAGNOSIS — Z01419 Encounter for gynecological examination (general) (routine) without abnormal findings: Secondary | ICD-10-CM | POA: Diagnosis not present

## 2018-12-08 DIAGNOSIS — Z1231 Encounter for screening mammogram for malignant neoplasm of breast: Secondary | ICD-10-CM | POA: Diagnosis not present

## 2018-12-08 DIAGNOSIS — Z130 Encounter for screening for diseases of the blood and blood-forming organs and certain disorders involving the immune mechanism: Secondary | ICD-10-CM | POA: Diagnosis not present

## 2018-12-08 DIAGNOSIS — Z6824 Body mass index (BMI) 24.0-24.9, adult: Secondary | ICD-10-CM | POA: Diagnosis not present

## 2018-12-08 DIAGNOSIS — Z124 Encounter for screening for malignant neoplasm of cervix: Secondary | ICD-10-CM | POA: Diagnosis not present

## 2018-12-08 DIAGNOSIS — M545 Low back pain: Secondary | ICD-10-CM | POA: Diagnosis not present

## 2018-12-09 DIAGNOSIS — M5116 Intervertebral disc disorders with radiculopathy, lumbar region: Secondary | ICD-10-CM | POA: Diagnosis not present

## 2018-12-09 DIAGNOSIS — S330XXD Traumatic rupture of lumbar intervertebral disc, subsequent encounter: Secondary | ICD-10-CM | POA: Diagnosis not present

## 2018-12-09 DIAGNOSIS — M545 Low back pain: Secondary | ICD-10-CM | POA: Diagnosis not present

## 2018-12-09 DIAGNOSIS — Z124 Encounter for screening for malignant neoplasm of cervix: Secondary | ICD-10-CM | POA: Diagnosis not present

## 2018-12-12 DIAGNOSIS — B370 Candidal stomatitis: Secondary | ICD-10-CM | POA: Diagnosis not present

## 2018-12-14 DIAGNOSIS — M9903 Segmental and somatic dysfunction of lumbar region: Secondary | ICD-10-CM | POA: Diagnosis not present

## 2018-12-14 DIAGNOSIS — M531 Cervicobrachial syndrome: Secondary | ICD-10-CM | POA: Diagnosis not present

## 2018-12-14 DIAGNOSIS — M9901 Segmental and somatic dysfunction of cervical region: Secondary | ICD-10-CM | POA: Diagnosis not present

## 2018-12-14 DIAGNOSIS — M9902 Segmental and somatic dysfunction of thoracic region: Secondary | ICD-10-CM | POA: Diagnosis not present

## 2018-12-15 DIAGNOSIS — M9902 Segmental and somatic dysfunction of thoracic region: Secondary | ICD-10-CM | POA: Diagnosis not present

## 2018-12-15 DIAGNOSIS — S330XXD Traumatic rupture of lumbar intervertebral disc, subsequent encounter: Secondary | ICD-10-CM | POA: Diagnosis not present

## 2018-12-15 DIAGNOSIS — M9901 Segmental and somatic dysfunction of cervical region: Secondary | ICD-10-CM | POA: Diagnosis not present

## 2018-12-15 DIAGNOSIS — M5116 Intervertebral disc disorders with radiculopathy, lumbar region: Secondary | ICD-10-CM | POA: Diagnosis not present

## 2018-12-15 DIAGNOSIS — M531 Cervicobrachial syndrome: Secondary | ICD-10-CM | POA: Diagnosis not present

## 2018-12-15 DIAGNOSIS — M545 Low back pain: Secondary | ICD-10-CM | POA: Diagnosis not present

## 2018-12-15 DIAGNOSIS — M9903 Segmental and somatic dysfunction of lumbar region: Secondary | ICD-10-CM | POA: Diagnosis not present

## 2018-12-22 DIAGNOSIS — M5116 Intervertebral disc disorders with radiculopathy, lumbar region: Secondary | ICD-10-CM | POA: Diagnosis not present

## 2018-12-22 DIAGNOSIS — S330XXD Traumatic rupture of lumbar intervertebral disc, subsequent encounter: Secondary | ICD-10-CM | POA: Diagnosis not present

## 2018-12-22 DIAGNOSIS — M545 Low back pain: Secondary | ICD-10-CM | POA: Diagnosis not present

## 2018-12-25 DIAGNOSIS — M9903 Segmental and somatic dysfunction of lumbar region: Secondary | ICD-10-CM | POA: Diagnosis not present

## 2018-12-25 DIAGNOSIS — M531 Cervicobrachial syndrome: Secondary | ICD-10-CM | POA: Diagnosis not present

## 2018-12-25 DIAGNOSIS — M9901 Segmental and somatic dysfunction of cervical region: Secondary | ICD-10-CM | POA: Diagnosis not present

## 2018-12-25 DIAGNOSIS — M9902 Segmental and somatic dysfunction of thoracic region: Secondary | ICD-10-CM | POA: Diagnosis not present

## 2019-05-12 DIAGNOSIS — M9901 Segmental and somatic dysfunction of cervical region: Secondary | ICD-10-CM | POA: Diagnosis not present

## 2019-05-12 DIAGNOSIS — M9902 Segmental and somatic dysfunction of thoracic region: Secondary | ICD-10-CM | POA: Diagnosis not present

## 2019-05-12 DIAGNOSIS — M531 Cervicobrachial syndrome: Secondary | ICD-10-CM | POA: Diagnosis not present

## 2019-05-12 DIAGNOSIS — M9903 Segmental and somatic dysfunction of lumbar region: Secondary | ICD-10-CM | POA: Diagnosis not present

## 2019-05-14 DIAGNOSIS — M9903 Segmental and somatic dysfunction of lumbar region: Secondary | ICD-10-CM | POA: Diagnosis not present

## 2019-05-14 DIAGNOSIS — M9901 Segmental and somatic dysfunction of cervical region: Secondary | ICD-10-CM | POA: Diagnosis not present

## 2019-05-14 DIAGNOSIS — M9902 Segmental and somatic dysfunction of thoracic region: Secondary | ICD-10-CM | POA: Diagnosis not present

## 2019-05-14 DIAGNOSIS — M531 Cervicobrachial syndrome: Secondary | ICD-10-CM | POA: Diagnosis not present

## 2019-06-02 DIAGNOSIS — B370 Candidal stomatitis: Secondary | ICD-10-CM | POA: Diagnosis not present

## 2019-06-07 DIAGNOSIS — H90A21 Sensorineural hearing loss, unilateral, right ear, with restricted hearing on the contralateral side: Secondary | ICD-10-CM | POA: Diagnosis not present

## 2019-06-09 DIAGNOSIS — H903 Sensorineural hearing loss, bilateral: Secondary | ICD-10-CM | POA: Diagnosis not present

## 2019-06-30 DIAGNOSIS — M2550 Pain in unspecified joint: Secondary | ICD-10-CM | POA: Diagnosis not present

## 2019-06-30 DIAGNOSIS — R5383 Other fatigue: Secondary | ICD-10-CM | POA: Diagnosis not present

## 2019-06-30 DIAGNOSIS — M79644 Pain in right finger(s): Secondary | ICD-10-CM | POA: Diagnosis not present

## 2019-06-30 DIAGNOSIS — E559 Vitamin D deficiency, unspecified: Secondary | ICD-10-CM | POA: Diagnosis not present

## 2019-07-06 DIAGNOSIS — Z7689 Persons encountering health services in other specified circumstances: Secondary | ICD-10-CM | POA: Diagnosis not present

## 2019-07-06 DIAGNOSIS — F419 Anxiety disorder, unspecified: Secondary | ICD-10-CM | POA: Diagnosis not present

## 2019-07-06 DIAGNOSIS — K219 Gastro-esophageal reflux disease without esophagitis: Secondary | ICD-10-CM | POA: Diagnosis not present

## 2019-07-16 DIAGNOSIS — Z20828 Contact with and (suspected) exposure to other viral communicable diseases: Secondary | ICD-10-CM | POA: Diagnosis not present

## 2019-07-20 ENCOUNTER — Ambulatory Visit: Payer: BC Managed Care – PPO | Admitting: Neurology

## 2019-08-04 DIAGNOSIS — M531 Cervicobrachial syndrome: Secondary | ICD-10-CM | POA: Diagnosis not present

## 2019-08-04 DIAGNOSIS — M9903 Segmental and somatic dysfunction of lumbar region: Secondary | ICD-10-CM | POA: Diagnosis not present

## 2019-08-04 DIAGNOSIS — M9902 Segmental and somatic dysfunction of thoracic region: Secondary | ICD-10-CM | POA: Diagnosis not present

## 2019-08-04 DIAGNOSIS — M9901 Segmental and somatic dysfunction of cervical region: Secondary | ICD-10-CM | POA: Diagnosis not present

## 2019-09-13 ENCOUNTER — Ambulatory Visit: Payer: BC Managed Care – PPO | Admitting: Neurology

## 2019-09-13 ENCOUNTER — Other Ambulatory Visit: Payer: Self-pay

## 2019-09-13 ENCOUNTER — Encounter: Payer: Self-pay | Admitting: Neurology

## 2019-09-13 DIAGNOSIS — H918X9 Other specified hearing loss, unspecified ear: Secondary | ICD-10-CM

## 2019-09-13 DIAGNOSIS — H9190 Unspecified hearing loss, unspecified ear: Secondary | ICD-10-CM | POA: Insufficient documentation

## 2019-09-13 DIAGNOSIS — G43709 Chronic migraine without aura, not intractable, without status migrainosus: Secondary | ICD-10-CM | POA: Insufficient documentation

## 2019-09-13 DIAGNOSIS — D333 Benign neoplasm of cranial nerves: Secondary | ICD-10-CM

## 2019-09-13 DIAGNOSIS — G43109 Migraine with aura, not intractable, without status migrainosus: Secondary | ICD-10-CM

## 2019-09-13 MED ORDER — ELETRIPTAN HYDROBROMIDE 40 MG PO TABS: ORAL_TABLET | ORAL | 11 refills | Status: DC

## 2019-09-13 MED ORDER — SUMATRIPTAN SUCCINATE 6 MG/0.5ML ~~LOC~~ SOAJ: 6.0000 mg | SUBCUTANEOUS | 11 refills | Status: DC | PRN

## 2019-09-13 MED ORDER — TROKENDI XR 50 MG PO CP24: 50.0000 mg | ORAL_CAPSULE | Freq: Every day | ORAL | 6 refills | Status: DC

## 2019-09-13 NOTE — Progress Notes (Signed)
[id number]    Provider:  Dr Jaynee Eagles Requesting Provider: Orpah Melter, MD Primary Care Provider:  Orpah Melter, MD  CC:  Migraines  HPI:  Tricia Richards is a 53 y.o. female here as requested by Orpah Melter, MD for migraines. 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 She has a long history of migraines. She was on Topamax in the past which very much helped however she discontinued due to hair loss. But migraines are worsening. She has started having more headaches. Several migraines a month. In the last year. Pounding, usually behind the left eye, can spread, light sensitivity, sound sensitivity, nausea, no recent vomiting. She can wake with one and the injection works, she may use the relpax if less acute. 15 headache days a month and 8-12 of those are migraineous and moderately severe or severe. Weather is a huge trigger. Menses alos make it worse. No food triggers. Brother has migraines. She just had labs and she declines any imaging, headaches are usual, no changes in severity or quality, no new symptoms. Started at the age of 13. Wasn't diagnosed until 2000. No other focal neurologic deficits, associated symptoms, inciting events or modifiable factors.  Reviewed notes, labs and imaging from outside physicians, which showed:  I reviewed notes from a the [facility] for neurologic disorders.  MRI of the brain April 19, 2009 showed signal changes in the left temporal bone, left internal auditory canal, and adjacent portion of the left temporal lobe postsurgical changes, no recurrent or residual enhancing neoplasm within the left internal auditory canal, solitary focus of high T2 weighted signal above the left lateral ventricle is nonspecific and is of unlikely significance, MRI of the brain is otherwise  normal, reviewed report.  MRI showed normal MRI without evidence for aneurysm, significant stenosis or vascular malformation.  I reviewed office notes, she was on Topamax in the past, she lost weight, weight improved off of Topamax, patient however was unhappy with her weight gain which may have been Zoloft related but Zoloft appeared to be helping with her mood disorder specifically her anxiety.  At the time per headaches were twice a week, unchanged, taking Axert which works at least 50% of the time.  They continued Zoloft.  They reintroduced Topamax at a low dose of 25 mg twice daily with plans to titrate higher.  Other than Axert she also uses injectable Imitrex.  Dictated in February 2008.   On subsequent visits she reported 3 migraines per month, can last less than an hour treated with a triptan, Relpax in the past was used as well.  In the past she had stopped Topamax due to a hair loss.  She was doing well without the medications for some time.  She resected her left-sided vestibular schwannoma.  Also hearing aid for left-sided deafness.  Prior notes reviewed showed that max dose on Topamax was 50 mg twice daily, she did have a remote history of kidney stones no problems taking Topamax.  She is also had IV infusions in the past with Toradol, Depacon and Solu-Medrol for severe headache acutely, and also Medrol Dosepaks for flareups and migraines and headaches.  Review of Systems: Patient complains of symptoms per HPI as well as the following symptoms: headaches, hearing loss. Pertinent negatives and positives per HPI. All others negative.   Social History   Socioeconomic History   Marital status: Married  Spouse name: Not on file   Number of children: 0   Years of education: Not on file   Highest education level: Associate degree: academic program  Occupational History   Not on file  Social Needs   Financial resource strain: Not on file   Food insecurity    Worry: Not on file      Inability: Not on file   Transportation needs    Medical: Not on file    Non-medical: Not on file  Tobacco Use   Smoking status: Former Smoker    Types: Cigarettes    Quit date: 1995    Years since quitting: 25.9   Smokeless tobacco: Never Used   Tobacco comment: 1 pack a week for 5-6 years  Substance and Sexual Activity   Alcohol use: Yes    Comment: occasionally    Drug use: Never   Sexual activity: Not on file  Lifestyle   Physical activity    Days per week: Not on file    Minutes per session: Not on file   Stress: Not on file  Relationships   Social connections    Talks on phone: Not on file    Gets together: Not on file    Attends religious service: Not on file    Active member of club or organization: Not on file    Attends meetings of clubs or organizations: Not on file    Relationship status: Not on file   Intimate partner violence    Fear of current or ex partner: Not on file    Emotionally abused: Not on file    Physically abused: Not on file    Forced sexual activity: Not on file  Other Topics Concern   Not on file  Social History Narrative   Lives at home with her husband   Right handed   Caffeine: coffee 2 cups/day, energy powder mix-in (C4) every morning    Family History  Problem Relation Age of Onset   Depression Mother    Diabetes Father    Hypertension Father    Heart disease Father    Migraines Neg Hx     Past Medical History:  Diagnosis Date   Brain tumor [facility])    acoustic neuroma, benign. Left   Cataracts, bilateral    Deafness    left   Kidney stones    Migraine    Migraine headache without aura    Migraine with aura     Patient Active Problem List   Diagnosis Date Noted   Chronic migraine without aura without status migrainosus, not intractable 09/13/2019   Migraine with aura and without status migrainosus, not intractable 09/13/2019   Hearing impairment 09/13/2019   Vestibular schwannoma ([place])  09/13/2019    Past Surgical History:  Procedure Laterality Date   ACOUSTIC NEUROMA RESECTION  11/2001   tummy tuck      Current Outpatient Medications  Medication Sig Dispense Refill   eletriptan (RELPAX) 40 MG tablet TAKE ONE TABLET AT ONSET OF HEADACHE. MAY BE REPEATED AFTER 2 HOURS, NO MORE THAN 2 DOSES IN 24 HRS. 10 tablet 11   lansoprazole (PREVACID) 30 MG capsule Take 30 mg by mouth daily at 12 noon.     sertraline (ZOLOFT) 50 MG tablet Take 75 mg by mouth daily.     SUMAtriptan 6 MG/0.5ML SOAJ Inject 6 mg into the skin as needed. May repeat in 2 hours. Maximum 2x per day. Max 2 days a week. 8 mL 11  Topiramate ER (TROKENDI XR) 50 MG CP24 Take 50 mg by mouth at bedtime. 30 capsule 6   No current facility-administered medications for this visit.     Allergies as of 09/13/2019 - Review Complete 09/13/2019  Allergen Reaction Noted   Bactrim [sulfamethoxazole-trimethoprim] Rash 09/13/2019    Vitals: BP 136/84 (BP Location: Left Arm, Patient Position: Sitting)    Pulse 80    Temp (!) 97.2 F (36.2 C) Comment: taken at front door   Ht 5\' 2"  (1.575 m)    Wt 134 lb (60.8 kg) Comment: pt reported   BMI 24.51 kg/m  Last Weight:  Wt Readings from Last 1 Encounters:  09/13/19 134 lb (60.8 kg)   Last Height:   Ht Readings from Last 1 Encounters:  09/13/19 5\' 2"  (1.575 m)     Physical exam: Exam: Gen: NAD, conversant, well nourised, well groomed                     CV: RRR, no MRG. No Carotid Bruits. No peripheral edema, warm, nontender Eyes: Conjunctivae clear without exudates or hemorrhage  Neuro: Detailed Neurologic Exam  Speech:    Speech is normal; fluent and spontaneous with normal comprehension.  Cognition:    The patient is oriented to person, place, and time;     recent and remote memory intact;     language fluent;     normal attention, concentration,     fund of knowledge Cranial Nerves:    The pupils are equal, round, and reactive to light. The  fundi are normal and spontaneous venous pulsations are present. Visual fields are full to finger confrontation. Extraocular movements are intact. Trigeminal sensation is intact and the muscles of mastication are normal. The face is symmetric. The palate elevates in the midline. Left ear deaf. Voice is normal. Shoulder shrug is normal. The tongue has normal motion without fasciculations.   Coordination:    Normal finger to nose   Gait:    Normal native gait  Motor Observation:    No asymmetry, no atrophy, and no involuntary movements noted. Tone:    Normal muscle tone.    Posture:    Posture is normal. normal erect    Strength:    Strength is V/V in the upper and lower limbs.      Sensation: intact to LT     Reflex Exam:  DTR's:    Deep tendon reflexes in the upper and lower extremities are normal bilaterally.   Toes:    The toes are downgoing bilaterally.   Clonus:    Clonus is absent.    Assessment/Plan:  53 year old with history of chronic migraines.  - Did well on Topamax in the past, had hair loss, wants to restart however.  We also discussed other oral preventatives, the new CGRP medications, Botox for migraines, we discussed topiramate IR as well as extended release and she would like to start the extended release formulation.  We will start this for preventative.  We discussed her kidney stones which are remote, she did not have any problems on Topamax in the past, stay well-hydrated.  We also discussed acute medications and she will continue her current protocol.  She declines MRI of the brain, no changes in severity, quality and no new symptoms.   Meds ordered this encounter  Medications   Topiramate ER (TROKENDI XR) 50 MG CP24    Sig: Take 50 mg by mouth at bedtime.    Dispense:  30 capsule    Refill:  6    Patient has copay card; she can have medication for $0 regardless of insurance approval or copay amount.   SUMAtriptan 6 MG/0.5ML SOAJ    Sig: Inject 6 mg  into the skin as needed. May repeat in 2 hours. Maximum 2x per day. Max 2 days a week.    Dispense:  8 mL    Refill:  11    Auto Injector preferred   eletriptan (RELPAX) 40 MG tablet    Sig: TAKE ONE TABLET AT ONSET OF HEADACHE. MAY BE REPEATED AFTER 2 HOURS, NO MORE THAN 2 DOSES IN 24 HRS.    Dispense:  10 tablet    Refill:  11   Discussed: To prevent or relieve headaches, try the following: Cool Compress. Lie down and place a cool compress on your head.  Avoid headache triggers. If certain foods or odors seem to have triggered your migraines in the past, avoid them. A headache diary might help you identify triggers.  Include physical activity in your daily routine. Try a daily walk or other moderate aerobic exercise.  Manage stress. Find healthy ways to cope with the stressors, such as delegating tasks on your to-do list.  Practice relaxation techniques. Try deep breathing, yoga, massage and visualization.  Eat regularly. Eating regularly scheduled meals and maintaining a healthy diet might help prevent headaches. Also, drink plenty of fluids.  Follow a regular sleep schedule. Sleep deprivation might contribute to headaches Consider biofeedback. With this mind-body technique, you learn to control certain bodily functions -- such as muscle tension, heart rate and blood pressure -- to prevent headaches or reduce headache pain.    Proceed to emergency room if you experience new or worsening symptoms or symptoms do not resolve, if you have new neurologic symptoms or if headache is severe, or for any concerning symptom.   Provided education and documentation from American headache Society toolbox including articles on: chronic migraine medication overuse headache, chronic migraines, prevention of migraines, behavioral and other nonpharmacologic treatments for headache.  Discussed:  There is increased risk for stroke in women with migraine with aura and a contraindication for the combined  contraceptive pill for use by women who have migraine with aura. The risk for women with migraine without aura is lower. However other risk factors like smoking are far more likely to increase stroke risk than migraine. There is a recommendation for no smoking and for the use of OCPs without estrogen such as progestogen only pills particularly for women with migraine with aura.Marland Kitchen People who have migraine headaches with auras may be 3 times more likely to have a stroke caused by a blood clot, compared to migraine patients who don't see auras. Women who take hormone-replacement therapy may be 30 percent more likely to suffer a clot-based stroke than women not taking medication containing estrogen. Other risk factors like smoking and high blood pressure may be  much more important  Cc: Orpah Melter, MD,    Sarina Ill, MD  [facility] [address] [place], [place] [address]  Phone [phone] Fax [phone]

## 2019-09-13 NOTE — Patient Instructions (Signed)
Start Trokendi (Topiramate ER) at 50mg   Topiramate extended-release capsules What is this medicine? TOPIRAMATE (toe PYRE a mate) is used to treat seizures in adults or children with epilepsy. It is also used for the prevention of migraine headaches. This medicine may be used for other purposes; ask your health care provider or pharmacist if you have questions. COMMON BRAND NAME(S): Trokendi XR What should I tell my health care provider before I take this medicine? They need to know if you have any of these conditions:  cirrhosis of the liver or liver disease  diarrhea  glaucoma  kidney stones or kidney disease  lung disease like asthma, obstructive pulmonary disease, emphysema  metabolic acidosis  on a ketogenic diet  scheduled for surgery or a procedure  suicidal thoughts, plans, or attempt; a previous suicide attempt by you or a family member  an unusual or allergic reaction to topiramate, other medicines, foods, dyes, or preservatives  pregnant or trying to get pregnant  breast-feeding How should I use this medicine? Take this medicine by mouth with a glass of water. Follow the directions on the prescription label. Trokendi XR capsules must be swallowed whole. Do not sprinkle on food, break, crush, dissolve, or chew. Qudexy XR capsules may be swallowed whole or opened and sprinkled on a small amount of soft food. This mixture must be swallowed immediately. Do not chew or store mixture for later use. You may take this medicine with meals. Take your medicine at regular intervals. Do not take it more often than directed. Talk to your pediatrician regarding the use of this medicine in children. Special care may be needed. While Trokendi XR may be prescribed for children as young as 6 years and Qudexy XR may be prescribed for children as young as 2 years for selected conditions, precautions do apply. Overdosage: If you think you have taken too much of this medicine contact a poison  control center or emergency room at once. NOTE: This medicine is only for you. Do not share this medicine with others. What if I miss a dose? If you miss a dose, take it as soon as you can. If it is almost time for your next dose, take only that dose. Do not take double or extra doses. What may interact with this medicine? Do not take this medicine with any of the following medications:  probenecid This medicine may also interact with the following medications:  acetazolamide  alcohol  amitriptyline  birth control pills  digoxin  hydrochlorothiazide  lithium  medicines for pain, sleep, or muscle relaxation  metformin  methazolamide  other seizure or epilepsy medicines  pioglitazone  risperidone This list may not describe all possible interactions. Give your health care provider a list of all the medicines, herbs, non-prescription drugs, or dietary supplements you use. Also tell them if you smoke, drink alcohol, or use illegal drugs. Some items may interact with your medicine. What should I watch for while using this medicine? Visit your doctor or health care professional for regular checks on your progress. Do not stop taking this medicine suddenly. This increases the risk of seizures if you are using this medicine to control epilepsy. Wear a medical identification bracelet or chain to say you have epilepsy or seizures, and carry a card that lists all your medicines. This medicine can decrease sweating and increase your body temperature. Watch for signs of deceased sweating or fever, especially in children. Avoid extreme heat, hot baths, and saunas. Be careful about exercising, especially in  hot weather. Contact your health care provider right away if you notice a fever or decrease in sweating. You should drink plenty of fluids while taking this medicine. If you have had kidney stones in the past, this will help to reduce your chances of forming kidney stones. If you have  stomach pain, with nausea or vomiting and yellowing of your eyes or skin, call your doctor immediately. You may get drowsy, dizzy, or have blurred vision. Do not drive, use machinery, or do anything that needs mental alertness until you know how this medicine affects you. To reduce dizziness, do not sit or stand up quickly, especially if you are an older patient. Alcohol can increase drowsiness and dizziness. Avoid alcoholic drinks. Do not drink alcohol for 6 hours before or 6 hours after taking Trokendi XR. If you notice blurred vision, eye pain, or other eye problems, seek medical attention at once for an eye exam. The use of this medicine may increase the chance of suicidal thoughts or actions. Pay special attention to how you are responding while on this medicine. Any worsening of mood, or thoughts of suicide or dying should be reported to your health care professional right away. This medicine may increase the chance of developing metabolic acidosis. If left untreated, this can cause kidney stones, bone disease, or slowed growth in children. Symptoms include breathing fast, fatigue, loss of appetite, irregular heartbeat, or loss of consciousness. Call your doctor immediately if you experience any of these side effects. Also, tell your doctor about any surgery you plan on having while taking this medicine since this may increase your risk for metabolic acidosis. Birth control pills may not work properly while you are taking this medicine. Talk to your doctor about using an extra method of birth control. Women who become pregnant while using this medicine may enroll in the [place] Pregnancy Registry by calling [phone]. This registry collects information about the safety of antiepileptic drug use during pregnancy. What side effects may I notice from receiving this medicine? Side effects that you should report to your doctor or health care professional as soon as  possible:  allergic reactions like skin rash, itching or hives, swelling of the face, lips, or tongue  decreased sweating and/or rise in body temperature  depression  difficulty breathing, fast or irregular breathing patterns  difficulty speaking  difficulty walking or controlling muscle movements  hearing impairment  redness, blistering, peeling or loosening of the skin, including inside the mouth  tingling, pain or numbness in the hands or feet  unusually weak or tired  worsening of mood, thoughts or actions of suicide or dying Side effects that usually do not require medical attention (report to your doctor or health care professional if they continue or are bothersome):  altered taste  back pain, joint or muscle aches and pains  diarrhea, or constipation  headache  loss of appetite  nausea  stomach upset, indigestion  tremors This list may not describe all possible side effects. Call your doctor for medical advice about side effects. You may report side effects to FDA at 1-800-FDA-1088. Where should I keep my medicine? Keep out of the reach of children. Store at room temperature between 15 and 30 degrees C (59 and 86 degrees F) in a tightly closed container. Protect from moisture. Throw away any unused medicine after the expiration date. NOTE: This sheet is a summary. It may not cover all possible information. If you have questions about this medicine, talk to  your doctor, pharmacist, or health care provider.  2020 Elsevier/Gold Standard (2016-01-19 12:33:11)

## 2019-09-27 ENCOUNTER — Other Ambulatory Visit: Payer: Self-pay | Admitting: Neurology

## 2019-09-27 MED ORDER — TROKENDI XR 100 MG PO CP24: 100.0000 mg | ORAL_CAPSULE | Freq: Every day | ORAL | 11 refills | Status: DC

## 2019-11-22 ENCOUNTER — Encounter: Payer: Self-pay | Admitting: *Deleted

## 2019-11-22 ENCOUNTER — Other Ambulatory Visit: Payer: Self-pay | Admitting: Neurology

## 2019-11-22 DIAGNOSIS — R34 Anuria and oliguria: Secondary | ICD-10-CM

## 2019-11-22 NOTE — Progress Notes (Signed)
Labs

## 2019-11-22 NOTE — Progress Notes (Signed)
Pt added 4 supplements to [employer] which have been added to her med list.

## 2019-11-22 NOTE — Progress Notes (Signed)
labs

## 2019-11-22 NOTE — Telephone Encounter (Signed)
I added the supplements to her med list.

## 2019-11-23 ENCOUNTER — Other Ambulatory Visit: Payer: Self-pay

## 2019-11-23 ENCOUNTER — Other Ambulatory Visit (INDEPENDENT_AMBULATORY_CARE_PROVIDER_SITE_OTHER): Payer: Self-pay

## 2019-11-23 DIAGNOSIS — R34 Anuria and oliguria: Secondary | ICD-10-CM

## 2019-11-23 DIAGNOSIS — Z0289 Encounter for other administrative examinations: Secondary | ICD-10-CM

## 2019-11-24 ENCOUNTER — Telehealth: Payer: Self-pay | Admitting: Neurology

## 2019-11-24 NOTE — Telephone Encounter (Signed)
Hilda Blades, would you be able to call and get me a copy of patient's last BMP from her primary care specifically I'd like he BUN and Creatinine. Thank you

## 2019-11-25 LAB — COMPREHENSIVE METABOLIC PANEL
ALT: 9 IU/L (ref 0–32)
AST: 16 IU/L (ref 0–40)
Albumin/Globulin Ratio: 1.5 (ref 1.2–2.2)
Albumin: 4 g/dL (ref 3.8–4.9)
Alkaline Phosphatase: 50 IU/L (ref 39–117)
BUN/Creatinine Ratio: 14 (ref 9–23)
BUN: 16 mg/dL (ref 6–24)
Bilirubin Total: 0.2 mg/dL (ref 0.0–1.2)
CO2: 22 mmol/L (ref 20–29)
Calcium: 9.5 mg/dL (ref 8.7–10.2)
Chloride: 107 mmol/L — ABNORMAL HIGH (ref 96–106)
Creatinine, Ser: 1.11 mg/dL — ABNORMAL HIGH (ref 0.57–1.00)
GFR calc Af Amer: 65 mL/min/{1.73_m2} (ref >59)
GFR calc non Af Amer: 56 mL/min/{1.73_m2} — ABNORMAL LOW (ref >59)
Globulin, Total: 2.7 g/dL (ref 1.5–4.5)
Glucose: 80 mg/dL (ref 65–99)
Potassium: 4.1 mmol/L (ref 3.5–5.2)
Sodium: 141 mmol/L (ref 134–144)
Total Protein: 6.7 g/dL (ref 6.0–8.5)

## 2019-11-25 LAB — CBC
Hematocrit: 39.7 % (ref 34.0–46.6)
Hemoglobin: 13.3 g/dL (ref 11.1–15.9)
MCH: 29.6 pg (ref 26.6–33.0)
MCHC: 33.5 g/dL (ref 31.5–35.7)
MCV: 88 fL (ref 79–97)
Platelets: 293 10*3/uL (ref 150–450)
RBC: 4.5 x10E6/uL (ref 3.77–5.28)
RDW: 13.4 % (ref 11.7–15.4)
WBC: 7.3 10*3/uL (ref 3.4–10.8)

## 2019-11-25 LAB — URINALYSIS, ROUTINE W REFLEX MICROSCOPIC
Bilirubin, UA: NEGATIVE
Glucose, UA: NEGATIVE
Ketones, UA: NEGATIVE
Leukocytes,UA: NEGATIVE
Nitrite, UA: NEGATIVE
Protein,UA: NEGATIVE
RBC, UA: NEGATIVE
Specific Gravity, UA: 1.017 (ref 1.005–1.030)
Urobilinogen, Ur: 0.2 mg/dL (ref 0.2–1.0)
pH, UA: 7 (ref 5.0–7.5)

## 2019-11-25 LAB — URINE CULTURE

## 2019-11-25 NOTE — Telephone Encounter (Signed)
Labs from Dr. Orpah Melter office on your desk.

## 2019-11-25 NOTE — Progress Notes (Signed)
Tricia Richards, everything looks fine. Your creatinine is slightly elevated which can be seen with dehydration I would indeed increase your fluids but watch your urine I don;t want you to drink too much fluids either thanks Dr Jaynee Eagles

## 2019-12-10 DIAGNOSIS — Z6824 Body mass index (BMI) 24.0-24.9, adult: Secondary | ICD-10-CM | POA: Diagnosis not present

## 2019-12-10 DIAGNOSIS — Z1231 Encounter for screening mammogram for malignant neoplasm of breast: Secondary | ICD-10-CM | POA: Diagnosis not present

## 2019-12-10 DIAGNOSIS — Z124 Encounter for screening for malignant neoplasm of cervix: Secondary | ICD-10-CM | POA: Diagnosis not present

## 2019-12-10 DIAGNOSIS — Z01419 Encounter for gynecological examination (general) (routine) without abnormal findings: Secondary | ICD-10-CM | POA: Diagnosis not present

## 2019-12-10 DIAGNOSIS — Z1151 Encounter for screening for human papillomavirus (HPV): Secondary | ICD-10-CM | POA: Diagnosis not present

## 2019-12-17 DIAGNOSIS — Z131 Encounter for screening for diabetes mellitus: Secondary | ICD-10-CM | POA: Diagnosis not present

## 2019-12-17 DIAGNOSIS — E7800 Pure hypercholesterolemia, unspecified: Secondary | ICD-10-CM | POA: Diagnosis not present

## 2019-12-17 DIAGNOSIS — E559 Vitamin D deficiency, unspecified: Secondary | ICD-10-CM | POA: Diagnosis not present

## 2019-12-17 DIAGNOSIS — Z0000 Encounter for general adult medical examination without abnormal findings: Secondary | ICD-10-CM | POA: Diagnosis not present

## 2020-05-08 DIAGNOSIS — M9902 Segmental and somatic dysfunction of thoracic region: Secondary | ICD-10-CM | POA: Diagnosis not present

## 2020-05-08 DIAGNOSIS — M9903 Segmental and somatic dysfunction of lumbar region: Secondary | ICD-10-CM | POA: Diagnosis not present

## 2020-05-08 DIAGNOSIS — M531 Cervicobrachial syndrome: Secondary | ICD-10-CM | POA: Diagnosis not present

## 2020-05-08 DIAGNOSIS — M9901 Segmental and somatic dysfunction of cervical region: Secondary | ICD-10-CM | POA: Diagnosis not present

## 2020-08-25 DIAGNOSIS — Z23 Encounter for immunization: Secondary | ICD-10-CM | POA: Diagnosis not present

## 2020-08-25 DIAGNOSIS — R350 Frequency of micturition: Secondary | ICD-10-CM | POA: Diagnosis not present

## 2020-08-25 DIAGNOSIS — N3000 Acute cystitis without hematuria: Secondary | ICD-10-CM | POA: Diagnosis not present

## 2020-08-25 DIAGNOSIS — N343 Urethral syndrome, unspecified: Secondary | ICD-10-CM | POA: Diagnosis not present

## 2020-08-30 DIAGNOSIS — F419 Anxiety disorder, unspecified: Secondary | ICD-10-CM | POA: Diagnosis not present

## 2020-08-30 DIAGNOSIS — K219 Gastro-esophageal reflux disease without esophagitis: Secondary | ICD-10-CM | POA: Diagnosis not present

## 2020-08-30 DIAGNOSIS — Z7689 Persons encountering health services in other specified circumstances: Secondary | ICD-10-CM | POA: Diagnosis not present

## 2020-09-08 DIAGNOSIS — Z20822 Contact with and (suspected) exposure to covid-19: Secondary | ICD-10-CM | POA: Diagnosis not present

## 2020-09-12 ENCOUNTER — Telehealth (HOSPITAL_COMMUNITY): Payer: Self-pay

## 2020-09-12 ENCOUNTER — Encounter (HOSPITAL_COMMUNITY): Payer: Self-pay

## 2020-09-12 NOTE — Telephone Encounter (Signed)
Called to Discuss with patient about Covid symptoms and the use of the monoclonal antibody infusion for those with mild to moderate Covid symptoms and at a high risk of hospitalization.     Pt appears to qualify for this infusion due to co-morbid conditions and/or a member of an at-risk group in accordance with the FDA Emergency Use Authorization.    Unable to reach pt    

## 2020-09-13 ENCOUNTER — Other Ambulatory Visit: Payer: Self-pay | Admitting: Nurse Practitioner

## 2020-09-13 ENCOUNTER — Encounter: Payer: Self-pay | Admitting: Nurse Practitioner

## 2020-09-13 DIAGNOSIS — U071 COVID-19: Secondary | ICD-10-CM

## 2020-09-13 NOTE — Progress Notes (Signed)
I connected by phone with Tricia Richards on 09/13/2020 at 7:49 PM to discuss the potential use of a treatment for mild to moderate COVID-19 viral infection in non-hospitalized patients.  This patient is a 53 y.o. female that meets the FDA criteria for Emergency Use Authorization of bamlanivimab/etesevimab, casirivimab\imdevimab, or sotrovimab  Has a (+) direct SARS-CoV-2 viral test result  Has mild or moderate COVID-19   Is ? 55 years of age and weighs ? 40 kg  Is NOT hospitalized due to COVID-19  Is NOT requiring oxygen therapy or requiring an increase in baseline oxygen flow rate due to COVID-19  Is within 10 days of symptom onset  Has at least one of the high risk factor(s) for progression to severe COVID-19 and/or hospitalization as defined in EUA.  Specific high risk criteria : Other high risk medical condition per CDC:  former smoker   I have spoken and communicated the following to the patient or parent/caregiver:  1. FDA has authorized the emergency use of bamlanivimab/etesevimab, casirivimab\imdevimab, or sotrovimab for the treatment of mild to moderate COVID-19 in adults and pediatric patients with positive results of direct SARS-CoV-2 viral testing who are 4 years of age and older weighing at least 40 kg, and who are at high risk for progressing to severe COVID-19 and/or hospitalization.  2. The significant known and potential risks and benefits of bamlanivimab/etesevimab, casirivimab\imdevimab, or sotrovimab, and the extent to which such potential risks and benefits are unknown.  3. Information on available alternative treatments and the risks and benefits of those alternatives, including clinical trials.  4. Patients treated with bamlanivimab/etesevimab, casirivimab\imdevimab, or sotrovimab should continue to self-isolate and use infection control measures (e.g., wear mask, isolate, social distance, avoid sharing personal items, clean and disinfect "high touch" surfaces,  and frequent handwashing) according to CDC guidelines.   5. The patient or parent/caregiver has the option to accept or refuse bamlanivimab/etesevimab, casirivimab\imdevimab, or sotrovimab.  After reviewing this information with the patient, the patient has agreed to receive one of the available covid 19 monoclonal antibodies and will be provided an appropriate fact sheet prior to infusion.Tricia Richards, [place], AGNP-C [phone] ([place])

## 2020-09-14 ENCOUNTER — Ambulatory Visit (HOSPITAL_COMMUNITY)
Admission: RE | Admit: 2020-09-14 | Discharge: 2020-09-14 | Disposition: A | Discharge status: Home / Self Care | Payer: BC Managed Care – PPO | Source: Ambulatory Visit | Attending: Pulmonary Disease | Admitting: Pulmonary Disease

## 2020-09-14 DIAGNOSIS — Z87891 Personal history of nicotine dependence: Secondary | ICD-10-CM | POA: Diagnosis not present

## 2020-09-14 DIAGNOSIS — U071 COVID-19: Secondary | ICD-10-CM | POA: Insufficient documentation

## 2020-09-14 MED ORDER — SOTROVIMAB 500 MG/8ML IV SOLN
500.0000 mg | Freq: Once | INTRAVENOUS | Status: AC
  Administered 2020-09-14: 500 mg via INTRAVENOUS

## 2020-09-14 MED ORDER — SODIUM CHLORIDE 0.9 % IV SOLN: INTRAVENOUS | Status: DC | PRN

## 2020-09-14 MED ORDER — FAMOTIDINE IN NACL 20-0.9 MG/50ML-% IV SOLN: 20.0000 mg | Freq: Once | INTRAVENOUS | Status: DC | PRN

## 2020-09-14 MED ORDER — DIPHENHYDRAMINE HCL 50 MG/ML IJ SOLN: 50.0000 mg | Freq: Once | INTRAMUSCULAR | Status: DC | PRN

## 2020-09-14 MED ORDER — EPINEPHRINE 0.3 MG/0.3ML IJ SOAJ: 0.3000 mg | Freq: Once | INTRAMUSCULAR | Status: DC | PRN

## 2020-09-14 MED ORDER — METHYLPREDNISOLONE SODIUM SUCC 125 MG IJ SOLR: 125.0000 mg | Freq: Once | INTRAMUSCULAR | Status: DC | PRN

## 2020-09-14 MED ORDER — ALBUTEROL SULFATE HFA 108 (90 BASE) MCG/ACT IN AERS: 2.0000 | INHALATION_SPRAY | Freq: Once | RESPIRATORY_TRACT | Status: DC | PRN

## 2020-09-14 NOTE — Discharge Instructions (Signed)
10 Things You Can Do to Manage Your COVID-19 Symptoms at Home If you have possible or confirmed COVID-19: 1. Stay home from work and school. And stay away from other public places. If you must go out, avoid using any kind of public transportation, ridesharing, or taxis. 2. Monitor your symptoms carefully. If your symptoms get worse, call your healthcare provider immediately. 3. Get rest and stay hydrated. 4. If you have a medical appointment, call the healthcare provider ahead of time and tell them that you have or may have COVID-19. 5. For medical emergencies, call 911 and notify the dispatch personnel that you have or may have COVID-19. 6. Cover your cough and sneezes with a tissue or use the inside of your elbow. 7. Wash your hands often with soap and water for at least 20 seconds or clean your hands with an alcohol-based hand sanitizer that contains at least 60% alcohol. 8. As much as possible, stay in a specific room and away from other people in your home. Also, you should use a separate bathroom, if available. If you need to be around other people in or outside of the home, wear a mask. 9. Avoid sharing personal items with other people in your household, like dishes, towels, and bedding. 10. Clean all surfaces that are touched often, like counters, tabletops, and doorknobs. Use household cleaning sprays or wipes according to the label instructions. cdc.gov/coronavirus 04/14/2019 This information is not intended to replace advice given to you by your health care provider. Make sure you discuss any questions you have with your health care provider. Document Revised: 09/16/2019 Document Reviewed: 09/16/2019 Elsevier Patient Education  2020 Elsevier Inc. What types of side effects do monoclonal antibody drugs cause?  Common side effects  In general, the more common side effects caused by monoclonal antibody drugs include: . Allergic reactions, such as hives or itching . Flu-like signs and  symptoms, including chills, fatigue, fever, and muscle aches and pains . Nausea, vomiting . Diarrhea . Skin rashes . Low blood pressure   The CDC is recommending patients who receive monoclonal antibody treatments wait at least 90 days before being vaccinated.  Currently, there are no data on the safety and efficacy of mRNA COVID-19 vaccines in persons who received monoclonal antibodies or convalescent plasma as part of COVID-19 treatment. Based on the estimated half-life of such therapies as well as evidence suggesting that reinfection is uncommon in the 90 days after initial infection, vaccination should be deferred for at least 90 days, as a precautionary measure until additional information becomes available, to avoid interference of the antibody treatment with vaccine-induced immune responses. If you have any questions or concerns after the infusion please call the Advanced Practice Provider on call at [phone]. This number is ONLY intended for your use regarding questions or concerns about the infusion post-treatment side-effects.  Please do not provide this number to others for use. For return to work notes please contact your primary care provider.   If someone you know is interested in receiving treatment please have them call the COVID hotline at [phone].   

## 2020-09-14 NOTE — Progress Notes (Signed)
Patient reviewed Fact Sheet for Patients, Parents, and Caregivers for Emergency Use Authorization (EUA) of Sotrovimab for the Treatment of Coronavirus. Patient also reviewed and is agreeable to the estimated cost of treatment. Patient is agreeable to proceed.   

## 2020-09-14 NOTE — Progress Notes (Signed)
Diagnosis: COVID-19  Physician: Dr. Patrick Wright  Procedure: [facility] Infusion [facility] Med: Sotrovimab infusion - Provided patient with sotrovimab fact sheet for patients, parents, and caregivers prior to infusion.   Complications: No immediate complications noted  Discharge: Discharged home    

## 2020-09-15 ENCOUNTER — Other Ambulatory Visit (HOSPITAL_COMMUNITY): Payer: Self-pay

## 2020-09-15 ENCOUNTER — Other Ambulatory Visit: Payer: Self-pay | Admitting: Neurology

## 2020-09-18 ENCOUNTER — Telehealth: Payer: Self-pay | Admitting: Neurology

## 2020-09-18 MED ORDER — SUMATRIPTAN SUCCINATE 6 MG/0.5ML ~~LOC~~ SOAJ: 6.0000 mg | SUBCUTANEOUS | 1 refills | Status: DC | PRN

## 2020-09-18 NOTE — Telephone Encounter (Signed)
Pt has called and scheduled an annual f/u.  Pt is also on wait list for next available.  Pt is requesting a refill for SUMAtriptan 6 MG/0.5ML SOAJ.  Pharmacy: [id number]

## 2020-09-18 NOTE — Addendum Note (Signed)
Addended by: Gildardo Griffes on: 09/18/2020 11:52 AM   Modules accepted: Orders

## 2020-09-18 NOTE — Telephone Encounter (Signed)
Refill sent to [facility].  

## 2020-11-29 NOTE — Patient Instructions (Signed)
Below is our plan:  We will continue eletriptan and sumatriptan injections for abortive therapy. Please take 1 tablet (or injection) at onset of headache. May take 1 additional tablet (or injection) in 2 hours if needed. Do not take more than 2 doses of either triptan in 24 hours or more than 10 in a month.    Please make sure you are staying well hydrated. I recommend 50-60 ounces daily. Well balanced diet and regular exercise encouraged. Consistent sleep schedule with 6-8 hours recommended.   Please continue follow up with care team as directed.   Follow up with PCP for refills, call us if needed   You may receive a survey regarding today's visit. I encourage you to leave honest feed back as I do use this information to improve patient care. Thank you for seeing me today!      Migraine Headache A migraine headache is a very strong throbbing pain on one side or both sides of your head. This type of headache can also cause other symptoms. It can last from 4 hours to 3 days. Talk with your doctor about what things may bring on (trigger) this condition. What are the causes? The exact cause of this condition is not known. This condition may be triggered or caused by:  Drinking alcohol.  Smoking.  Taking medicines, such as: ? Medicine used to treat chest pain (nitroglycerin). ? Birth control pills. ? Estrogen. ? Some blood pressure medicines.  Eating or drinking certain products.  Doing physical activity. Other things that may trigger a migraine headache include:  Having a menstrual period.  Pregnancy.  Hunger.  Stress.  Not getting enough sleep or getting too much sleep.  Weather changes.  Tiredness (fatigue). What increases the risk?  Being 21-22 years old.  Being female.  Having a family history of migraine headaches.  Being Caucasian.  Having depression or anxiety.  Being very overweight. What are the signs or symptoms?  A throbbing pain. This pain  may: ? Happen in any area of the head, such as on one side or both sides. ? Make it hard to do daily activities. ? Get worse with physical activity. ? Get worse around bright lights or loud noises.  Other symptoms may include: ? Feeling sick to your stomach (nauseous). ? Vomiting. ? Dizziness. ? Being sensitive to bright lights, loud noises, or smells.  Before you get a migraine headache, you may get warning signs (an aura). An aura may include: ? Seeing flashing lights or having blind spots. ? Seeing bright spots, halos, or zigzag lines. ? Having tunnel vision or blurred vision. ? Having numbness or a tingling feeling. ? Having trouble talking. ? Having weak muscles.  Some people have symptoms after a migraine headache (postdromal phase), such as: ? Tiredness. ? Trouble thinking (concentrating). How is this treated?  Taking medicines that: ? Relieve pain. ? Relieve the feeling of being sick to your stomach. ? Prevent migraine headaches.  Treatment may also include: ? Having acupuncture. ? Avoiding foods that bring on migraine headaches. ? Learning ways to control your body functions (biofeedback). ? Therapy to help you know and deal with negative thoughts (cognitive behavioral therapy). Follow these instructions at home: Medicines  Take over-the-counter and prescription medicines only as told by your doctor.  Ask your doctor if the medicine prescribed to you: ? Requires you to avoid driving or using heavy machinery. ? Can cause trouble pooping (constipation). You may need to take these steps to prevent or  treat trouble pooping:  Drink enough fluid to keep your pee (urine) pale yellow.  Take over-the-counter or prescription medicines.  Eat foods that are high in fiber. These include beans, whole grains, and fresh fruits and vegetables.  Limit foods that are high in fat and sugar. These include fried or sweet foods. Lifestyle  Do not drink alcohol.  Do not use  any products that contain nicotine or tobacco, such as cigarettes, e-cigarettes, and chewing tobacco. If you need help quitting, ask your doctor.  Get at least 8 hours of sleep every night.  Limit and deal with stress. General instructions  Keep a journal to find out what may bring on your migraine headaches. For example, write down: ? What you eat and drink. ? How much sleep you get. ? Any change in what you eat or drink. ? Any change in your medicines.  If you have a migraine headache: ? Avoid things that make your symptoms worse, such as bright lights. ? It may help to lie down in a dark, quiet room. ? Do not drive or use heavy machinery. ? Ask your doctor what activities are safe for you.  Keep all follow-up visits as told by your doctor. This is important.      Contact a doctor if:  You get a migraine headache that is different or worse than others you have had.  You have more than 15 headache days in one month. Get help right away if:  Your migraine headache gets very bad.  Your migraine headache lasts longer than 72 hours.  You have a fever.  You have a stiff neck.  You have trouble seeing.  Your muscles feel weak or like you cannot control them.  You start to lose your balance a lot.  You start to have trouble walking.  You pass out (faint).  You have a seizure. Summary  A migraine headache is a very strong throbbing pain on one side or both sides of your head. These headaches can also cause other symptoms.  This condition may be treated with medicines and changes to your lifestyle.  Keep a journal to find out what may bring on your migraine headaches.  Contact a doctor if you get a migraine headache that is different or worse than others you have had.  Contact your doctor if you have more than 15 headache days in a month. This information is not intended to replace advice given to you by your health care provider. Make sure you discuss any questions  you have with your health care provider. Document Revised: 01/22/2019 Document Reviewed: 11/12/2018 Elsevier Patient Education  [place].

## 2020-11-29 NOTE — Progress Notes (Addendum)
Chief Complaint  Patient presents with  . Follow-up    Rm 1 alone Pt is well, has about 3-4 migraines a month      HISTORY OF PRESENT ILLNESS: 11/30/20 ALL:  Tricia Richards is a 55 y.o. female here today for follow up for migraines. She was seen as a new patient by Dr Jaynee Eagles 09/13/2019. She was started on Trokendi. Eletriptan and sumatriptan injections used for abortive therapy. She felt that she was not able to run and exercise like normal on Trokendi. She has continued abortive therapy only. She feels this works well for her. She has about 3-4 migraines per month. She usually takes eletriptan first and sumatriptan injection if needed. She continues to exercise regularly. She is followed closely by PCP.     HISTORY (copied from Dr Cathren Laine previous note)  HPI:  Tricia Richards is a 55 y.o. female here as requested by Orpah Melter, MD for migraines. 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 She has a long history of migraines. She was on Topamax in the past which very much helped however she discontinued due to hair loss. But migraines are worsening. She has started having more headaches. Several migraines a month. In the last year. Pounding, usually behind the left eye, can spread, light sensitivity, sound sensitivity, nausea, no recent vomiting. She can wake with one and the injection works, she may use the relpax if less acute. 15 headache days a month and 8-12 of those are migraineous and moderately severe or severe. Weather is a huge trigger. Menses alos make it worse. No food triggers. Brother has migraines. She just had labs and she declines any imaging, headaches are usual, no changes in severity or quality, no new symptoms. Started at the age of 21. Wasn't diagnosed until 2000. No other focal neurologic deficits, associated  symptoms, inciting events or modifiable factors.  Reviewed notes, labs and imaging from outside physicians, which showed:  I reviewed notes from a the [facility] for neurologic disorders.  MRI of the brain April 19, 2009 showed signal changes in the left temporal bone, left internal auditory canal, and adjacent portion of the left temporal lobe postsurgical changes, no recurrent or residual enhancing neoplasm within the left internal auditory canal, solitary focus of high T2 weighted signal above the left lateral ventricle is nonspecific and is of unlikely significance, MRI of the brain is otherwise normal, reviewed report.  MRI showed normal MRI without evidence for aneurysm, significant stenosis or vascular malformation.  I reviewed office notes, she was on Topamax in the past, she lost weight, weight improved off of Topamax, patient however was unhappy with her weight gain which may have been Zoloft related but Zoloft appeared to be helping with her mood disorder specifically her anxiety.  At the time per headaches were twice a week, unchanged, taking Axert which works at least 50% of the time.  They continued Zoloft.  They reintroduced Topamax at a low dose of 25 mg twice daily with plans to titrate higher.  Other than Axert she also uses injectable Imitrex.  Dictated in February 2008.   On subsequent visits she reported 3 migraines per month, can last less than an hour treated with a triptan, Relpax in the past was used as well.  In the past she had stopped Topamax due to a hair loss.  She was doing well without the  medications for some time.  She resected her left-sided vestibular schwannoma.  Also hearing aid for left-sided deafness.  Prior notes reviewed showed that max dose on Topamax was 50 mg twice daily, she did have a remote history of kidney stones no problems taking Topamax.  She is also had IV infusions in the past with Toradol, Depacon and Solu-Medrol for severe headache acutely,  and also Medrol Dosepaks for flareups and migraines and headaches.    REVIEW OF SYSTEMS: Out of a complete 14 system review of symptoms, the patient complains only of the following symptoms, headaches and all other reviewed systems are negative.    ALLERGIES: Allergies  Allergen Reactions  . Bactrim [Sulfamethoxazole-Trimethoprim] Rash     HOME MEDICATIONS: Outpatient Medications Prior to Visit  Medication Sig Dispense Refill  . Calcium Citrate-Vitamin D (CALCIUM CITRATE+D3 PETITES PO) Take by mouth.    . Cholecalciferol (VITAMIN D3 PO) Take 5,000 Int'l Units by mouth.    . lansoprazole (PREVACID) 30 MG capsule Take 30 mg by mouth daily at 12 noon.    . Red Yeast Rice 600 MG CAPS Take by mouth.    . sertraline (ZOLOFT) 50 MG tablet Take 75 mg by mouth daily.    Marland Kitchen eletriptan (RELPAX) 40 MG tablet TAKE ONE TABLET AT ONSET OF HEADACHE. MAY BE REPEATED AFTER 2 HOURS, NO MORE THAN 2 DOSES IN 24 HRS. 10 tablet 11  . SUMAtriptan 6 MG/0.5ML SOAJ Inject 6 mg into the skin as needed. May repeat in 2 hours. Maximum 2x per day. Max 2 days a week. 8 mL 1  . Biotin w/ Vitamins C & E (HAIR/SKIN/NAILS PO) Take 500 mcg by mouth.    . Topiramate ER (TROKENDI XR) 100 MG CP24 Take 100 mg by mouth at bedtime. 30 capsule 11   No facility-administered medications prior to visit.     PAST MEDICAL HISTORY: Past Medical History:  Diagnosis Date  . Brain tumor ([place])    acoustic neuroma, benign. Left  . Cataracts, bilateral   . Deafness    left  . Kidney stones   . Migraine   . Migraine headache without aura   . Migraine with aura      PAST SURGICAL HISTORY: Past Surgical History:  Procedure Laterality Date  . ACOUSTIC NEUROMA RESECTION  11/2001  . tummy tuck       FAMILY HISTORY: Family History  Problem Relation Age of Onset  . Depression Mother   . Diabetes Father   . Hypertension Father   . Heart disease Father   . Migraines Neg Hx      SOCIAL HISTORY: Social History    Socioeconomic History  . Marital status: Married    Spouse name: Not on file  . Number of children: 0  . Years of education: Not on file  . Highest education level: Associate degree: academic program  Occupational History  . Not on file  Tobacco Use  . Smoking status: Former Smoker    Types: Cigarettes    Quit date: 1995    Years since quitting: 27.1  . Smokeless tobacco: Never Used  . Tobacco comment: 1 pack a week for 5-6 years  Vaping Use  . Vaping Use: Never used  Substance and Sexual Activity  . Alcohol use: Yes    Comment: occasionally   . Drug use: Never  . Sexual activity: Not on file  Other Topics Concern  . Not on file  Social History Narrative   Lives at home with  her husband   Right handed   Caffeine: coffee 2 cups/day, energy powder mix-in (C4) every morning   Social Determinants of Health   Financial Resource Strain: Not on file  Food Insecurity: Not on file  Transportation Needs: Not on file  Physical Activity: Not on file  Stress: Not on file  Social Connections: Not on file  Intimate Partner Violence: Not on file      PHYSICAL EXAM  Vitals:   11/30/20 0747  BP: 119/87  Pulse: 82  Height: 5\' 2"  (1.575 m)   Body mass index is 24.51 kg/m.   Generalized: Well developed, in no acute distress  Cardiology: normal rate and rhythm, no murmur auscultated  Respiratory: clear to auscultation bilaterally    Neurological examination  Mentation: Alert oriented to time, place, history taking. Follows all commands speech and language fluent Cranial nerve II-XII: Pupils were equal round reactive to light. Extraocular movements were full, visual field were full on confrontational test. Facial sensation and strength were normal. Head turning and shoulder shrug  were normal and symmetric. Motor: The motor testing reveals 5 over 5 strength of all 4 extremities. Good symmetric motor tone is noted throughout.  Sensory: Sensory testing is intact to soft  touch on all 4 extremities. No evidence of extinction is noted.  Coordination: Cerebellar testing reveals good finger-nose-finger and heel-to-shin bilaterally.  Gait and station: Gait is normal.  Reflexes: Deep tendon reflexes are symmetric and normal bilaterally.     DIAGNOSTIC DATA (LABS, IMAGING, TESTING) - I reviewed patient records, labs, notes, testing and imaging myself where available.  Lab Results  Component Value Date   WBC 7.3 11/23/2019   HGB 13.3 11/23/2019   HCT 39.7 11/23/2019   MCV 88 11/23/2019   PLT 293 11/23/2019      Component Value Date/Time   NA 141 11/23/2019 1031   K 4.1 11/23/2019 1031   CL 107 (H) 11/23/2019 1031   CO2 22 11/23/2019 1031   GLUCOSE 80 11/23/2019 1031   BUN 16 11/23/2019 1031   CREATININE 1.11 (H) 11/23/2019 1031   CALCIUM 9.5 11/23/2019 1031   PROT 6.7 11/23/2019 1031   ALBUMIN 4.0 11/23/2019 1031   AST 16 11/23/2019 1031   ALT 9 11/23/2019 1031   ALKPHOS 50 11/23/2019 1031   BILITOT 0.2 11/23/2019 1031   GFRNONAA 56 (L) 11/23/2019 1031   GFRAA 65 11/23/2019 1031   No results found for: CHOL, HDL, LDLCALC, LDLDIRECT, TRIG, CHOLHDL No results found for: HGBA1C No results found for: VITAMINB12 No results found for: TSH  No flowsheet data found.   No flowsheet data found.   ASSESSMENT AND PLAN  55 y.o. year old female  has a past medical history of Brain tumor ([place]), Cataracts, bilateral, Deafness, Kidney stones, Migraine, Migraine headache without aura, and Migraine with aura. here with   Chronic migraine without aura without status migrainosus, not intractable  Tricia Richards is doing well, today. She feels that migraines are well managed with abortive therapy. We will continue eletriptan and sumatriptan as prescribed. She is aware not to exceed 2 daily doses or 10 total monthly doses. She will continue healthy lifestyle habits. She will continue follow up with PCP. May continue refills with PCP. May call us as needed.   No  orders of the defined types were placed in this encounter.    Meds ordered this encounter  Medications  . eletriptan (RELPAX) 40 MG tablet    Sig: TAKE ONE TABLET AT ONSET OF HEADACHE. MAY  BE REPEATED AFTER 2 HOURS, NO MORE THAN 2 DOSES IN 24 HRS.    Dispense:  10 tablet    Refill:  11    Order Specific Question:   Supervising Provider    Answer:   Melvenia Beam [id number]  . SUMAtriptan 6 MG/0.5ML SOAJ    Sig: Inject 6 mg into the skin as needed. May repeat in 2 hours. Maximum 2x per day. Max 2 days a week.    Dispense:  4 mL    Refill:  5    Auto Injector preferred    Order Specific Question:   Supervising Provider    Answer:   Melvenia Beam [id number]      I spent 20 minutes of face-to-face and non-face-to-face time with patient.  This included previsit chart review, lab review, study review, order entry, electronic health record documentation, patient education.    Debbora Presto, MSN, FNP-C 11/30/2020, 8:45 AM  [facility] [address] [place], [place] [address] [phone]  Made any corrections needed, and agree with history, physical, neuro exam,assessment and plan as stated.     Sarina Ill, MD [facility]

## 2020-11-30 ENCOUNTER — Encounter: Payer: Self-pay | Admitting: Family Medicine

## 2020-11-30 ENCOUNTER — Ambulatory Visit: Payer: BC Managed Care – PPO | Admitting: Family Medicine

## 2020-11-30 VITALS — BP 119/87 | HR 82 | Ht 62.0 in

## 2020-11-30 DIAGNOSIS — G43709 Chronic migraine without aura, not intractable, without status migrainosus: Secondary | ICD-10-CM

## 2020-11-30 MED ORDER — SUMATRIPTAN SUCCINATE 6 MG/0.5ML ~~LOC~~ SOAJ: 6.0000 mg | SUBCUTANEOUS | 5 refills | Status: DC | PRN

## 2020-11-30 MED ORDER — ELETRIPTAN HYDROBROMIDE 40 MG PO TABS: ORAL_TABLET | ORAL | 11 refills | Status: DC

## 2020-12-15 DIAGNOSIS — H25812 Combined forms of age-related cataract, left eye: Secondary | ICD-10-CM | POA: Diagnosis not present

## 2020-12-27 DIAGNOSIS — Z0000 Encounter for general adult medical examination without abnormal findings: Secondary | ICD-10-CM | POA: Diagnosis not present

## 2020-12-27 DIAGNOSIS — Z13220 Encounter for screening for lipoid disorders: Secondary | ICD-10-CM | POA: Diagnosis not present

## 2020-12-27 DIAGNOSIS — R739 Hyperglycemia, unspecified: Secondary | ICD-10-CM | POA: Diagnosis not present

## 2021-01-12 ENCOUNTER — Other Ambulatory Visit: Payer: Self-pay | Admitting: Neurology

## 2021-01-17 DIAGNOSIS — Z01818 Encounter for other preprocedural examination: Secondary | ICD-10-CM | POA: Diagnosis not present

## 2021-01-17 DIAGNOSIS — H25812 Combined forms of age-related cataract, left eye: Secondary | ICD-10-CM | POA: Diagnosis not present

## 2021-01-17 DIAGNOSIS — H2512 Age-related nuclear cataract, left eye: Secondary | ICD-10-CM | POA: Diagnosis not present

## 2021-01-24 DIAGNOSIS — T63301A Toxic effect of unspecified spider venom, accidental (unintentional), initial encounter: Secondary | ICD-10-CM | POA: Diagnosis not present

## 2021-01-31 DIAGNOSIS — H2511 Age-related nuclear cataract, right eye: Secondary | ICD-10-CM | POA: Diagnosis not present

## 2021-01-31 DIAGNOSIS — H25811 Combined forms of age-related cataract, right eye: Secondary | ICD-10-CM | POA: Diagnosis not present

## 2021-02-13 DIAGNOSIS — Z01419 Encounter for gynecological examination (general) (routine) without abnormal findings: Secondary | ICD-10-CM | POA: Diagnosis not present

## 2021-02-13 DIAGNOSIS — Z130 Encounter for screening for diseases of the blood and blood-forming organs and certain disorders involving the immune mechanism: Secondary | ICD-10-CM | POA: Diagnosis not present

## 2021-02-13 DIAGNOSIS — Z6824 Body mass index (BMI) 24.0-24.9, adult: Secondary | ICD-10-CM | POA: Diagnosis not present

## 2021-02-13 DIAGNOSIS — Z1231 Encounter for screening mammogram for malignant neoplasm of breast: Secondary | ICD-10-CM | POA: Diagnosis not present

## 2021-02-13 DIAGNOSIS — Z1389 Encounter for screening for other disorder: Secondary | ICD-10-CM | POA: Diagnosis not present

## 2021-02-16 DIAGNOSIS — S00412A Abrasion of left ear, initial encounter: Secondary | ICD-10-CM | POA: Diagnosis not present

## 2021-02-16 DIAGNOSIS — X58XXXA Exposure to other specified factors, initial encounter: Secondary | ICD-10-CM | POA: Diagnosis not present

## 2021-02-21 ENCOUNTER — Other Ambulatory Visit: Payer: Self-pay | Admitting: Obstetrics and Gynecology

## 2021-02-21 DIAGNOSIS — R928 Other abnormal and inconclusive findings on diagnostic imaging of breast: Secondary | ICD-10-CM

## 2021-02-23 ENCOUNTER — Ambulatory Visit
Admission: RE | Admit: 2021-02-23 | Discharge: 2021-02-23 | Disposition: A | Discharge status: Home / Self Care | Payer: BC Managed Care – PPO | Source: Ambulatory Visit | Attending: Obstetrics and Gynecology | Admitting: Obstetrics and Gynecology

## 2021-02-23 ENCOUNTER — Other Ambulatory Visit: Payer: Self-pay

## 2021-02-23 DIAGNOSIS — R928 Other abnormal and inconclusive findings on diagnostic imaging of breast: Secondary | ICD-10-CM

## 2021-03-14 ENCOUNTER — Other Ambulatory Visit: Payer: BC Managed Care – PPO

## 2021-04-26 DIAGNOSIS — M7581 Other shoulder lesions, right shoulder: Secondary | ICD-10-CM | POA: Diagnosis not present

## 2021-04-26 DIAGNOSIS — M25521 Pain in right elbow: Secondary | ICD-10-CM | POA: Diagnosis not present

## 2021-05-29 DIAGNOSIS — M7701 Medial epicondylitis, right elbow: Secondary | ICD-10-CM | POA: Diagnosis not present

## 2021-05-29 DIAGNOSIS — M25511 Pain in right shoulder: Secondary | ICD-10-CM | POA: Diagnosis not present

## 2021-05-29 DIAGNOSIS — M542 Cervicalgia: Secondary | ICD-10-CM | POA: Diagnosis not present

## 2021-05-29 DIAGNOSIS — G5621 Lesion of ulnar nerve, right upper limb: Secondary | ICD-10-CM | POA: Diagnosis not present

## 2021-05-29 DIAGNOSIS — M778 Other enthesopathies, not elsewhere classified: Secondary | ICD-10-CM | POA: Diagnosis not present

## 2021-06-29 DIAGNOSIS — D229 Melanocytic nevi, unspecified: Secondary | ICD-10-CM | POA: Diagnosis not present

## 2021-06-29 DIAGNOSIS — F411 Generalized anxiety disorder: Secondary | ICD-10-CM | POA: Diagnosis not present

## 2021-06-29 DIAGNOSIS — Z23 Encounter for immunization: Secondary | ICD-10-CM | POA: Diagnosis not present

## 2021-08-07 IMAGING — US US BREAST*R* LIMITED INC AXILLA
1 series · 6 of 6 positions shown · non-contrast
Comparison: Previous exam(s).

CLINICAL DATA: 54-year-old female recalled from screening mammogram
dated 02/13/2021 for a possible right breast mass.

EXAM:
DIGITAL DIAGNOSTIC UNILATERAL RIGHT MAMMOGRAM WITH TOMOSYNTHESIS AND
CAD; ULTRASOUND RIGHT BREAST LIMITED
TECHNIQUE: Right digital diagnostic mammography and breast tomosynthesis was
performed. The images were evaluated with computer-aided detection.;
Targeted ultrasound examination of the right breast was performed

[Series 1: us breast*right* limited inc axilla · 0.06mm/px · 6 of 6 slices shown]
[im 1/6]
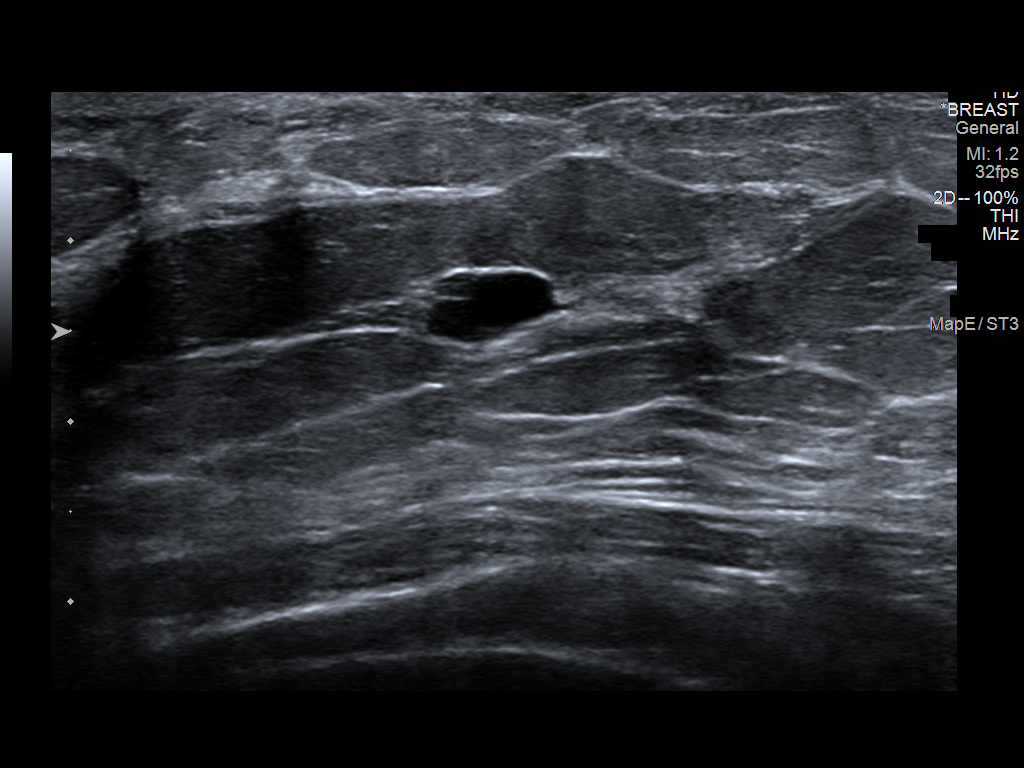
[im 2/6]
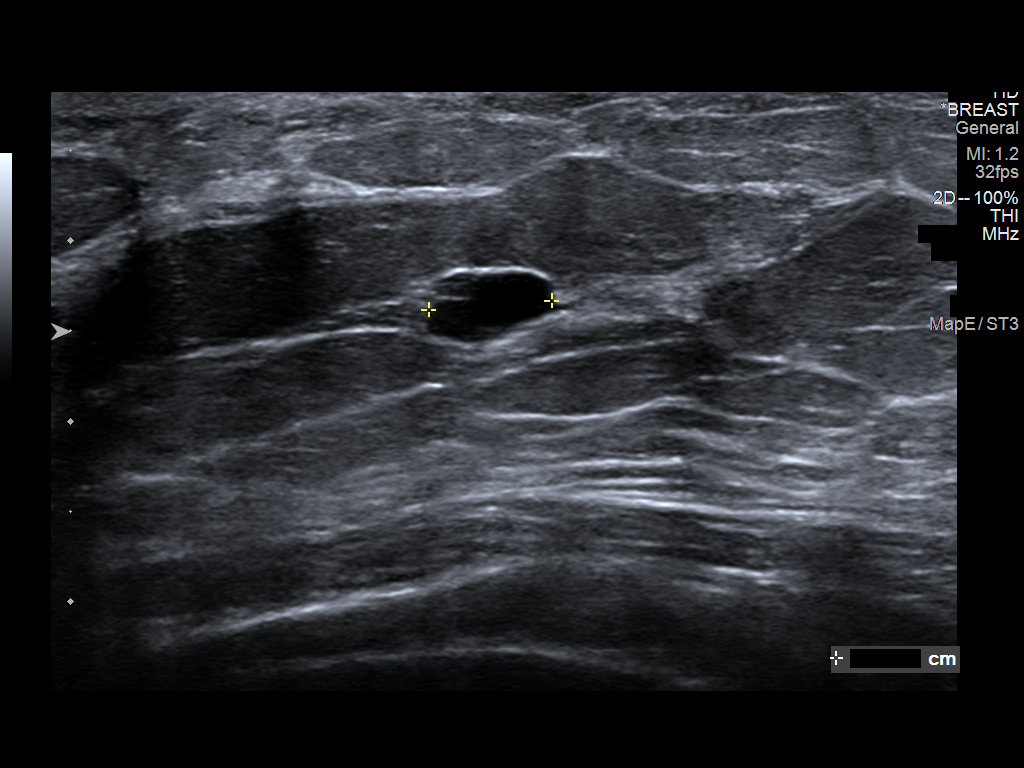
[im 3/6]
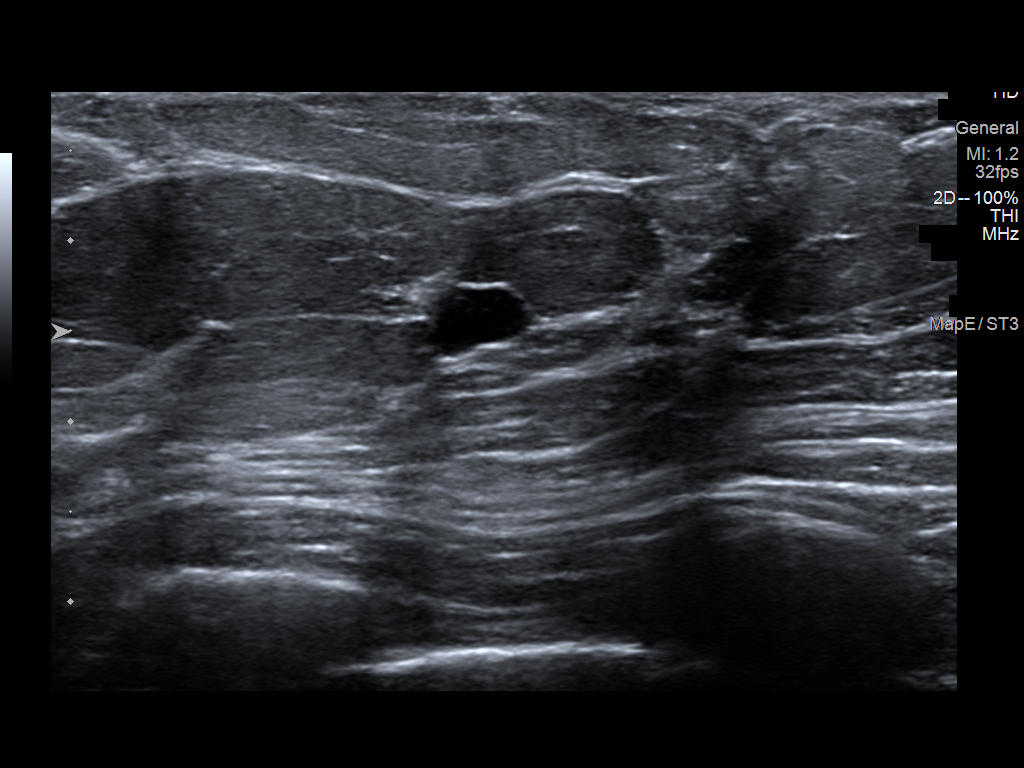
[im 4/6]
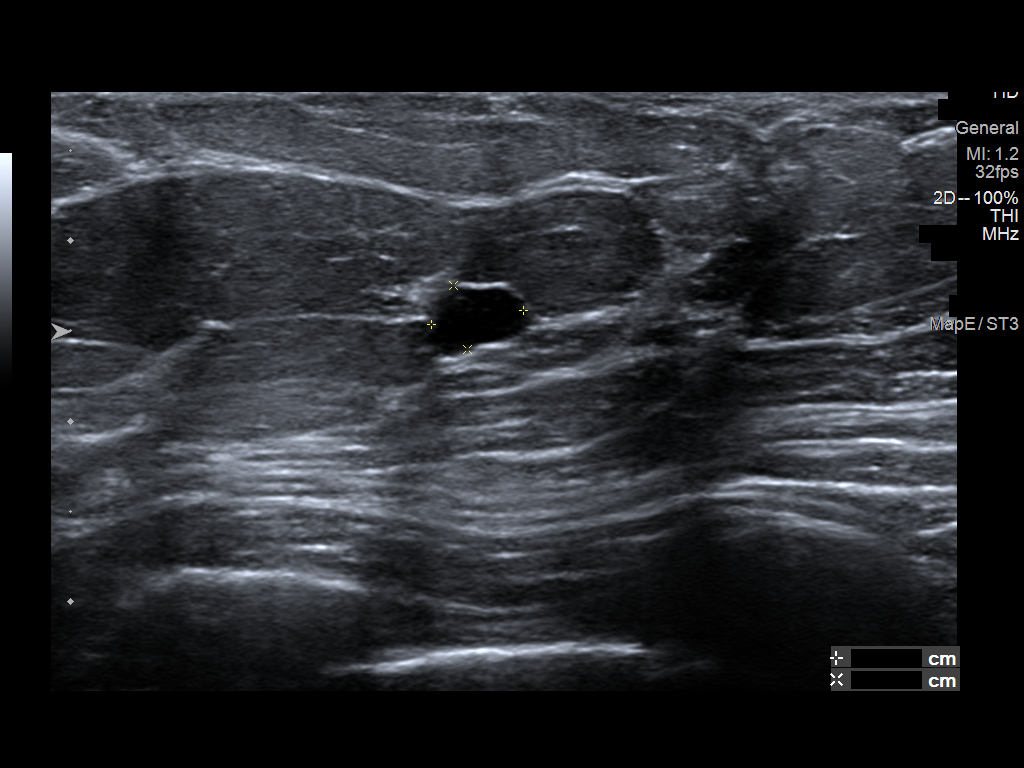
[im 5/6]
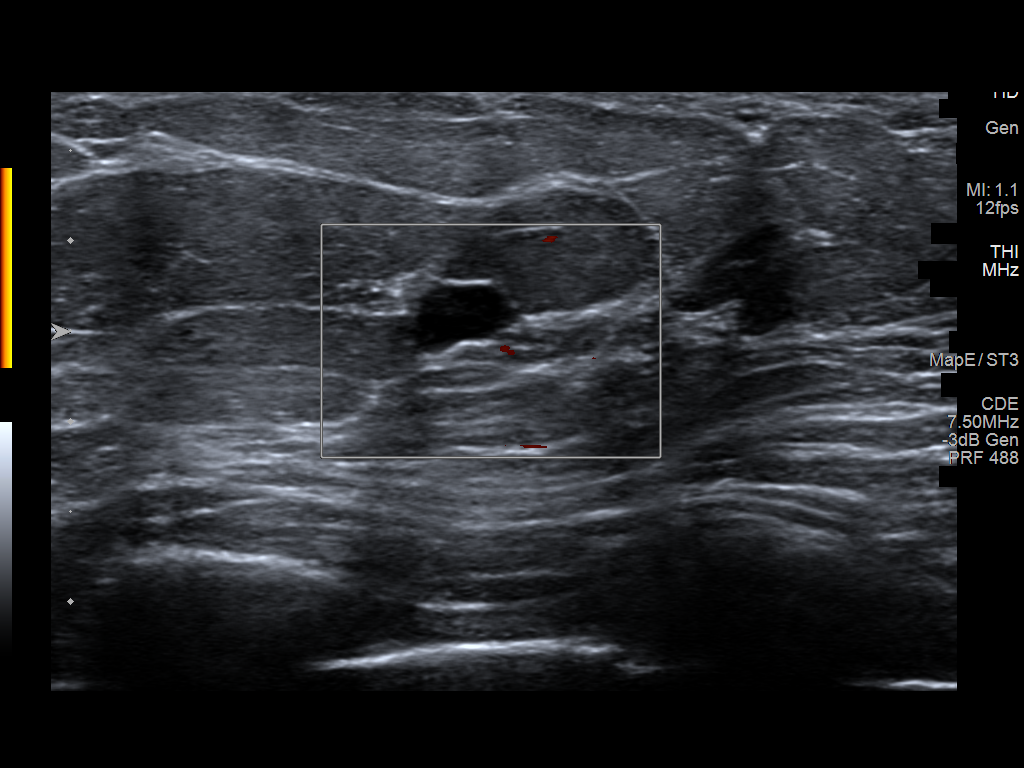
[im 6/6]
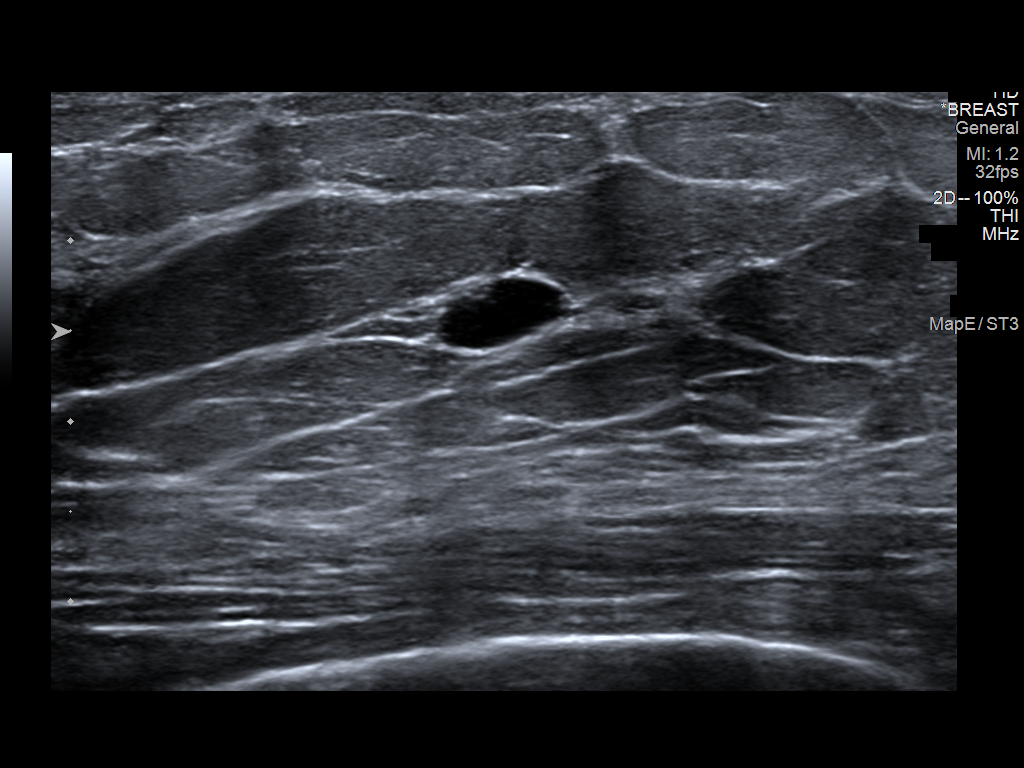

[6 of 6 positions shown; findings below may reference images not displayed]

ACR Breast Density Category c: The breast tissue is heterogeneously
dense, which may obscure small masses.
FINDINGS: There is a persistent oval, circumscribed equal density mass in the
lower inner quadrant of the right breast at anterior depth. Further
evaluation with ultrasound was performed.

Targeted ultrasound is performed, showing an oval, circumscribed
anechoic mass at the 3 o'clock position 1 cm from the nipple. It
measures 7 x 5 x 4 mm. There is no internal vascularity. This
correlates well with the mammographic finding and is consistent with
a benign simple cyst.
IMPRESSION: Benign simple cyst corresponding with the screening mammographic
findings. No further imaging follow-up required.

RECOMMENDATION:
Screening mammogram in one year.(Code:CG-S-W3A)

I have discussed the findings and recommendations with the patient.
If applicable, a reminder letter will be sent to the patient
regarding the next appointment.

BI-RADS CATEGORY  2: Benign.

## 2021-09-10 DIAGNOSIS — U071 COVID-19: Secondary | ICD-10-CM | POA: Diagnosis not present

## 2021-10-17 DIAGNOSIS — M7701 Medial epicondylitis, right elbow: Secondary | ICD-10-CM | POA: Diagnosis not present

## 2021-10-25 DIAGNOSIS — M7701 Medial epicondylitis, right elbow: Secondary | ICD-10-CM | POA: Diagnosis not present

## 2021-11-02 DIAGNOSIS — Z23 Encounter for immunization: Secondary | ICD-10-CM | POA: Diagnosis not present

## 2021-11-19 ENCOUNTER — Telehealth: Payer: Self-pay | Admitting: Family Medicine

## 2021-11-19 NOTE — Telephone Encounter (Signed)
..   Pt understands that although there may be some limitations with this type of visit, we will take all precautions to reduce any security or privacy concerns.  Pt understands that this will be treated like an in office visit and we will file with pt's insurance, and there may be a patient responsible charge related to this service. ? ?

## 2021-11-19 NOTE — Telephone Encounter (Signed)
ok 

## 2021-12-17 NOTE — Progress Notes (Addendum)
CC: Migraines   HISTORY OF PRESENT ILLNESS:  Virtual Visit via Video Note  I connected with Tricia Richards on 12/20/21 at  8:30 AM EST by a video enabled telemedicine application and verified that I am speaking with the correct person using two identifiers.  Location: Patient: home  Provider: office   I discussed the limitations of evaluation and management by telemedicine and the availability of in person appointments. The patient expressed understanding and agreed to proceed.   Follow Up Instructions:    I discussed the assessment and treatment plan with the patient. The patient was provided an opportunity to ask questions and all were answered. The patient agreed with the plan and demonstrated an understanding of the instructions.   The patient was advised to call back or seek an in-person evaluation if the symptoms worsen or if the condition fails to improve as anticipated.  I provided 30 minutes of non-face-to-face time during this encounter.   Melvenia Beam, MD   12/18/2021: Patient is here for yearly follow-up.  I initially saw her in 2020 and she was seen by my nurse practitioner Debbora Presto in February of 17th 2023.  At last appointment she felt she was doing well just with abortive therapy.  She tried Trokendi in the past but felt as though she had side effects, given she has about 3-4 migraines per month she usually takes eletriptan first and then sumatriptan injection as needed. She is having more migraines. She is having 6 migraine days a month, and 10 total headache days a month. We had a long discussion about options, injections, qulipta, other oral meds such as below. Decided on qulipta. 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 Imitrex po and injection and eletriptan, rizatriptan.   Patient complains of symptoms per HPI as well as the following symptoms: migraines . Pertinent negatives and positives per HPI. All others negative  November 30, 2021 ALL:  Tricia Richards is a 56 y.o. female here today for follow up for migraines. She was seen as a new patient by Dr Jaynee Eagles 09/13/2019. She was started on Trokendi. Eletriptan and sumatriptan injections used for abortive therapy. She felt that she was not able to run and exercise like normal on Trokendi. She has continued abortive therapy only. She feels this works well for her. She has about 3-4 migraines per month. She usually takes eletriptan first and sumatriptan injection if needed. She continues to exercise regularly. She is followed closely by PCP.    HISTORY (copied from Dr Cathren Laine previous note)  HPI:  Tricia Richards is a 56 y.o. female here as requested by Orpah Melter, MD for migraines. 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 She has a long history of migraines. She was on Topamax in the past which very much helped however she discontinued due to hair loss. But migraines are worsening. She has started having more headaches. Several migraines a month. In the last year. Pounding, usually behind the left eye, can spread, light sensitivity, sound sensitivity, nausea, no recent vomiting. She can wake with one and the injection works, she may use the relpax if less acute. 15 headache days a month and  8-12 of those are migraineous and moderately severe or severe. Weather is a huge trigger. Menses alos make it worse. No food triggers. Brother has migraines. She just had labs and she declines any imaging, headaches are usual, no changes in severity or quality, no  new symptoms. Started at the age of 43. Wasn't diagnosed until 2000. No other focal neurologic deficits, associated symptoms, inciting events or modifiable factors.   Reviewed notes, labs and imaging from outside physicians, which showed:   I reviewed notes from a the [facility] for neurologic disorders.  MRI of the brain April 19, 2009 showed signal changes in the left temporal bone, left internal auditory canal, and adjacent portion of the left temporal lobe postsurgical changes, no recurrent or residual enhancing neoplasm within the left internal auditory canal, solitary focus of high T2 weighted signal above the left lateral ventricle is nonspecific and is of unlikely significance, MRI of the brain is otherwise normal, reviewed report.   MRI showed normal MRI without evidence for aneurysm, significant stenosis or vascular malformation.   I reviewed office notes, she was on Topamax in the past, she lost weight, weight improved off of Topamax, patient however was unhappy with her weight gain which may have been Zoloft related but Zoloft appeared to be helping with her mood disorder specifically her anxiety.  At the time per headaches were twice a week, unchanged, taking Axert which works at least 50% of the time.  They continued Zoloft.  They reintroduced Topamax at a low dose of 25 mg twice daily with plans to titrate higher.  Other than Axert she also uses injectable Imitrex.  Dictated in February 2008.   On subsequent visits she reported 3 migraines per month, can last less than an hour treated with a triptan, Relpax in the past was used as well.  In the past she had stopped Topamax due to a hair loss.  She was doing well without the medications for some time.  She resected her left-sided vestibular schwannoma.  Also hearing aid for left-sided deafness.  Prior notes reviewed showed that max dose on Topamax was 50 mg twice daily, she did have a remote history of kidney stones no problems taking  Topamax.  She is also had IV infusions in the past with Toradol, Depacon and Solu-Medrol for severe headache acutely, and also Medrol Dosepaks for flareups and migraines and headaches.    REVIEW OF SYSTEMS: Out of a complete 14 system review of symptoms, the patient complains only of the following symptoms, headaches and all other reviewed systems are negative.    ALLERGIES: Allergies  Allergen Reactions   Bactrim [Sulfamethoxazole-Trimethoprim] Rash     HOME MEDICATIONS: Outpatient Medications Prior to Visit  Medication Sig Dispense Refill   Calcium Citrate-Vitamin D (CALCIUM CITRATE+D3 PETITES PO) Take by mouth.     sertraline (ZOLOFT) 50 MG tablet Take 75 mg by mouth daily.     Biotin w/ Vitamins C & E (HAIR/SKIN/NAILS PO) Take 500 mcg by mouth.     Cholecalciferol (VITAMIN D3 PO) Take 5,000 Int'l Units by mouth.     eletriptan (RELPAX) 40 MG tablet TAKE 1 TABLET BY MOUTH AT ONSET OF HEADACHE. MAY REPEAT AFTER 2 HOURS *MAX 2TABS/24 HOURS** 10 tablet 10   lansoprazole (PREVACID) 30 MG capsule Take 30 mg by mouth daily at 12 noon.     Red Yeast Rice 600 MG CAPS Take by mouth.     SUMAtriptan 6 MG/0.5ML SOAJ Inject 6 mg into the skin  as needed. May repeat in 2 hours. Maximum 2x per day. Max 2 days a week. 4 mL 5   No facility-administered medications prior to visit.     PAST MEDICAL HISTORY: Past Medical History:  Diagnosis Date   Brain tumor [facility])    acoustic neuroma, benign. Left   Cataracts, bilateral    Deafness    left   Kidney stones    Migraine    Migraine headache without aura    Migraine with aura      PAST SURGICAL HISTORY: Past Surgical History:  Procedure Laterality Date   ACOUSTIC NEUROMA RESECTION  11/2001   tummy tuck       FAMILY HISTORY: Family History  Problem Relation Age of Onset   Depression Mother    Diabetes Father    Hypertension Father    Heart disease Father    Migraines Neg Hx      SOCIAL HISTORY: Social History    Socioeconomic History   Marital status: Married    Spouse name: Not on file   Number of children: 0   Years of education: Not on file   Highest education level: Associate degree: academic program  Occupational History   Not on file  Tobacco Use   Smoking status: Former    Types: Cigarettes    Quit date: 1995    Years since quitting: 28.1   Smokeless tobacco: Never   Tobacco comments:    1 pack a week for 5-6 years  Vaping Use   Vaping Use: Never used  Substance and Sexual Activity   Alcohol use: Yes    Comment: occasionally    Drug use: Never   Sexual activity: Not on file  Other Topics Concern   Not on file  Social History Narrative   Lives at home with her husband   Right handed   Caffeine: coffee 2 cups/day, energy powder mix-in (C4) every morning   Social Determinants of Health   Financial Resource Strain: Not on file  Food Insecurity: Not on file  Transportation Needs: Not on file  Physical Activity: Not on file  Stress: Not on file  Social Connections: Not on file  Intimate Partner Violence: Not on file      Physical exam: Exam: Gen: NAD, conversant      CV:  Could not perform over Web Video. Denies palpitations or chest pain or SOB. VS: Breathing at a normal rate. Weight appears within normal limits. Not febrile. Eyes: Conjunctivae clear without exudates or hemorrhage  Neuro: Detailed Neurologic Exam  Speech:    Speech is normal; fluent and spontaneous with normal comprehension.  Cognition:    The patient is oriented to person, place, and time;     recent and remote memory intact;     language fluent;     normal attention, concentration,     fund of knowledge Cranial Nerves:    The pupils are equal, round, and reactive to light.  Cannot perform fundoscopic exam. Visual fields are full to finger confrontation. Extraocular movements are intact.  The face is symmetric with normal sensation. The palate elevates in the midline. Hearing intact. Voice  is normal. Shoulder shrug is normal. The tongue has normal motion without fasciculations.   Coordination:    Normal finger to nose  Gait:    Normal native gait  Motor Observation:   no involuntary movements noted. Tone:    Appears normal  Posture:    Posture is normal. normal erect  Strength:    Strength is anti-gravity and symmetric in the upper and lower limbs.      Sensation: intact to LT           DIAGNOSTIC DATA (LABS, IMAGING, TESTING) - I reviewed patient records, labs, notes, testing and imaging myself where available.  Lab Results  Component Value Date   WBC 7.3 11/23/2019   HGB 13.3 11/23/2019   HCT 39.7 11/23/2019   MCV 88 11/23/2019   PLT 293 11/23/2019      Component Value Date/Time   NA 141 11/23/2019 1031   K 4.1 11/23/2019 1031   CL 107 (H) 11/23/2019 1031   CO2 22 11/23/2019 1031   GLUCOSE 80 11/23/2019 1031   BUN 16 11/23/2019 1031   CREATININE 1.11 (H) 11/23/2019 1031   CALCIUM 9.5 11/23/2019 1031   PROT 6.7 11/23/2019 1031   ALBUMIN 4.0 11/23/2019 1031   AST 16 11/23/2019 1031   ALT 9 11/23/2019 1031   ALKPHOS 50 11/23/2019 1031   BILITOT 0.2 11/23/2019 1031   GFRNONAA 56 (L) 11/23/2019 1031   GFRAA 65 11/23/2019 1031   No results found for: CHOL, HDL, LDLCALC, LDLDIRECT, TRIG, CHOLHDL No results found for: HGBA1C No results found for: VITAMINB12 No results found for: TSH  No flowsheet data found.   No flowsheet data found.   ASSESSMENT AND PLAN  56 y.o. year old female  has a past medical history of Brain tumor ([place]), Cataracts, bilateral, Deafness, Kidney stones, Migraine, Migraine headache without aura, and Migraine with aura. here with Episodic migraines  Migraine without aura and without status migrainosus, not intractable  Migraine with aura and without status migrainosus, not intractable  She is having 6 migraine days a month, and 10 total headache days a month. We had a long discussion about options, injections,  qulipta, other oral meds such as below. Decided on qulipta. Also placed some samples up front for her. 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 Imitrex po and injection and eletriptan, rizatriptan.    Meds ordered this encounter  Medications   DISCONTD: Atogepant (QULIPTA) 60 MG TABS    Sig: Take 60 mg by mouth daily.    Dispense:  30 tablet    Refill:  6    Tried: topiramate(5 yrs), nortripty(side effects),blood pressure medications (propranolol, verapamil, carvedilol) contraindicated due to hypotension. 10 total headache day/month(6 migrianes) episodic migraines.   eletriptan (RELPAX) 40 MG tablet    Sig: TAKE 1 TABLET BY MOUTH AT ONSET OF HEADACHE. MAY REPEAT AFTER 2 HOURS *MAX 2TABS/24 HOURS**    Dispense:  10 tablet    Refill:  10   SUMAtriptan 6 MG/0.5ML SOAJ    Sig: Inject 6 mg into the skin as needed. May repeat in 2 hours. Maximum 2x per day. Max 2 days a week.    Dispense:  4 mL    Refill:  5    Auto Injector preferred   Atogepant (QULIPTA) 60 MG TABS    Sig: Take 60 mg by mouth daily.    Dispense:  30 tablet    Refill:  6    Tried: topiramate(5 yrs), nortripty(side effects),blood pressure medications (propranolol, verapamil, carvedilol) contraindicated due to hypotension. 10 total headache day/month(6 migrianes) episodic migraines.   Have discussed risk of stroke in women with migraine with aura  Signed out samples, placed them up front in a bag with prescription for patient Sent email for office to call and schedule her for 6 month follow up.   Sarina Ill MD [facility] [address], [place], [place] [address] [phone]  I spent 30 minutes  of face-to-face and non-face-to-face time with patient on the  1. Migraine without aura and without status migrainosus, not intractable   2. Migraine with aura and without status migrainosus, not intractable    diagnosis.  This included previsit chart review, lab review, study review, order entry, electronic health record documentation, patient education on the different diagnostic and therapeutic options, counseling and coordination of care, risks and benefits of management, compliance, or risk factor reduction

## 2021-12-18 ENCOUNTER — Telehealth: Payer: Self-pay | Admitting: Neurology

## 2021-12-18 ENCOUNTER — Telehealth: Payer: BC Managed Care – PPO | Admitting: Neurology

## 2021-12-18 ENCOUNTER — Encounter: Payer: Self-pay | Admitting: Neurology

## 2021-12-18 DIAGNOSIS — G43109 Migraine with aura, not intractable, without status migrainosus: Secondary | ICD-10-CM | POA: Diagnosis not present

## 2021-12-18 DIAGNOSIS — G43009 Migraine without aura, not intractable, without status migrainosus: Secondary | ICD-10-CM | POA: Insufficient documentation

## 2021-12-18 MED ORDER — SUMATRIPTAN SUCCINATE 6 MG/0.5ML ~~LOC~~ SOAJ: 6.0000 mg | SUBCUTANEOUS | 5 refills | Status: DC | PRN

## 2021-12-18 MED ORDER — ELETRIPTAN HYDROBROMIDE 40 MG PO TABS: ORAL_TABLET | ORAL | 10 refills | Status: DC

## 2021-12-18 MED ORDER — QULIPTA 60 MG PO TABS: 60.0000 mg | ORAL_TABLET | Freq: Every day | ORAL | 6 refills | Status: DC

## 2021-12-18 NOTE — Patient Instructions (Signed)
Start Qulipta daily for migraine prevention ?Continue Eletriptan and imitrex acutely ? ?There is increased risk for stroke in women with migraine with aura and a contraindication for the combined contraceptive pill for use by women who have migraine with aura. The risk for women with migraine without aura is lower. However other risk factors like smoking are far more likely to increase stroke risk than migraine. There is a recommendation for no smoking and for the use of OCPs without estrogen such as progestogen only pills particularly for women with migraine with aura.Tricia Richards People who have migraine headaches with auras may be 3 times more likely to have a stroke caused by a blood clot, compared to migraine patients who don't see auras. Women who take hormone-replacement therapy may be 30 percent more likely to suffer a clot-based stroke than women not taking medication containing estrogen. Other risk factors like smoking and high blood pressure may be  much more important ? ?Atogepant tablets ?What is this medication? ?ATOGEPANT (a TOE je pant) is used to prevent migraine headaches. ?This medicine may be used for other purposes; ask your health care provider or pharmacist if you have questions. ?COMMON BRAND NAME(S): QULIPTA ?What should I tell my care team before I take this medication? ?They need to know if you have any of these conditions: ?kidney disease ?liver disease ?an unusual or allergic reaction to atogepant, other medicines, foods, dyes, or preservatives ?pregnant or trying to get pregnant ?breast-feeding ?How should I use this medication? ?Take this medicine by mouth with water. Take it as directed on the prescription label at the same time every day. You can take it with or without food. If it upsets your stomach, take it with food. Keep taking it unless your health care provider tells you to stop. ?Talk to your health care provider about the use of this medicine in children. Special care may be  needed. ?Overdosage: If you think you have taken too much of this medicine contact a poison control center or emergency room at once. ?NOTE: This medicine is only for you. Do not share this medicine with others. ?What if I miss a dose? ?If you miss a dose, take it as soon as you can. If it is almost time for your next dose, take only that dose. Do not take double or extra doses. ?What may interact with this medication? ?carbamazepine ?certain medicines for fungal infections like itraconazole, ketoconazole ?clarithromycin ?cyclosporine ?efavirenz ?etravirine ?phenytoin ?rifampin ?[place] ?This list may not describe all possible interactions. Give your health care provider a list of all the medicines, herbs, non-prescription drugs, or dietary supplements you use. Also tell them if you smoke, drink alcohol, or use illegal drugs. Some items may interact with your medicine. ?What should I watch for while using this medication? ?Visit your health care provider for regular checks on your progress. Tell your health care provider if your symptoms do not start to get better or if they get worse. ?What side effects may I notice from receiving this medication? ?Side effects that you should report to your doctor or health care provider as soon as possible: ?allergic reactions (skin rash, itching or hives; swelling of the face, lips, tongue) ?light-colored stool ?liver injury (dark yellow or brown urine; general ill feeling or flu-like symptoms; loss of appetite, right upper belly pain; unusually weak or tired, yellowing of the eyes or skin) ?Side effects that usually do not require medical attention (report these to your doctor or health care provider if  they continue or are bothersome): ?constipation ?lack or loss of appetite ?nausea ?unusually weak or tired ?weight loss ?This list may not describe all possible side effects. Call your doctor for medical advice about side effects. You may report side effects to FDA at  1-800-FDA-1088. ?Where should I keep my medication? ?Keep out of the reach of children and pets. ?Store at room temperature between 20 and 25 degrees C (68 and 77 degrees F). Get rid of any unused medicine after the expiration date. ?To get rid of medicines that are no longer needed or have expired: ?Take the medicine to a medicine take-back program. Check with your pharmacy or law enforcement to find a location. ?If you cannot return the medicine, check the label or package insert to see if the medicine should be thrown out in the garbage or flushed down the toilet. If you are not sure, ask your health care provider. If it is safe to put it in the trash, take the medicine out of the container. Mix the medicine with cat litter, dirt, coffee grounds, or other unwanted substance. Seal the mixture in a bag or container. Put it in the trash. ?NOTE: This sheet is a summary. It may not cover all possible information. If you have questions about this medicine, talk to your doctor, pharmacist, or health care provider. ?? 2022 Elsevier/Gold Standard (2020-07-17 00:00:00) ? ?

## 2021-12-18 NOTE — Telephone Encounter (Signed)
Call her for 6 month video appt with amy please thanks ?

## 2022-01-16 DIAGNOSIS — Z136 Encounter for screening for cardiovascular disorders: Secondary | ICD-10-CM | POA: Diagnosis not present

## 2022-01-16 DIAGNOSIS — E559 Vitamin D deficiency, unspecified: Secondary | ICD-10-CM | POA: Diagnosis not present

## 2022-01-16 DIAGNOSIS — Z131 Encounter for screening for diabetes mellitus: Secondary | ICD-10-CM | POA: Diagnosis not present

## 2022-01-21 DIAGNOSIS — Z0000 Encounter for general adult medical examination without abnormal findings: Secondary | ICD-10-CM | POA: Diagnosis not present

## 2022-02-26 DIAGNOSIS — Z130 Encounter for screening for diseases of the blood and blood-forming organs and certain disorders involving the immune mechanism: Secondary | ICD-10-CM | POA: Diagnosis not present

## 2022-02-26 DIAGNOSIS — Z1231 Encounter for screening mammogram for malignant neoplasm of breast: Secondary | ICD-10-CM | POA: Diagnosis not present

## 2022-02-26 DIAGNOSIS — Z1389 Encounter for screening for other disorder: Secondary | ICD-10-CM | POA: Diagnosis not present

## 2022-02-26 DIAGNOSIS — Z01419 Encounter for gynecological examination (general) (routine) without abnormal findings: Secondary | ICD-10-CM | POA: Diagnosis not present

## 2022-02-26 DIAGNOSIS — Z780 Asymptomatic menopausal state: Secondary | ICD-10-CM | POA: Diagnosis not present

## 2022-04-08 DIAGNOSIS — H26492 Other secondary cataract, left eye: Secondary | ICD-10-CM | POA: Diagnosis not present

## 2022-04-08 DIAGNOSIS — H26491 Other secondary cataract, right eye: Secondary | ICD-10-CM | POA: Diagnosis not present

## 2022-04-09 DIAGNOSIS — J0100 Acute maxillary sinusitis, unspecified: Secondary | ICD-10-CM | POA: Diagnosis not present

## 2022-04-09 DIAGNOSIS — R051 Acute cough: Secondary | ICD-10-CM | POA: Diagnosis not present

## 2022-04-18 ENCOUNTER — Telehealth: Payer: Self-pay

## 2022-04-18 NOTE — Telephone Encounter (Signed)
PA for Louie [place] has received instant approval   (Key: [id number])  This request has been approved.  Please note any additional information provided by [facility] at the bottom of your screen.

## 2022-06-10 DIAGNOSIS — M7701 Medial epicondylitis, right elbow: Secondary | ICD-10-CM | POA: Diagnosis not present

## 2022-06-20 NOTE — Progress Notes (Deleted)
   PATIENT: MELEK POWNALL DOB: 1965/11/09  REASON FOR VISIT: follow up HISTORY FROM: patient  Virtual Visit via Telephone Note  I connected with Benna Dunks on 06/20/22 at  8:30 AM EDT by telephone and verified that I am speaking with the correct person using two identifiers.   I discussed the limitations, risks, security and privacy concerns of performing an evaluation and management service by telephone and the availability of in person appointments. I also discussed with the patient that there may be a patient responsible charge related to this service. The patient expressed understanding and agreed to proceed.   History of Present Illness:  06/20/22 ALL: HATSUE SIME is a 56 y.o. female here today for follow up for migraines. She was last seen by me 11/2020 and doing well on abortive therapy. She returned to see Dr Jaynee Eagles 12/2021 for worsening headaches and started on Qulipta. Eletriptan and sumatriptan injections continued for acute therapy.    Observations/Objective:  Generalized: Well developed, in no acute distress  Mentation: Alert oriented to time, place, history taking. Follows all commands speech and language fluent   Assessment and Plan:  56 y.o. year old female  has a past medical history of Brain tumor ([place]), Cataracts, bilateral, Deafness, Kidney stones, Migraine, Migraine headache without aura, and Migraine with aura. here with  No diagnosis found.  She is doing well. We will continue Qulipta '60mg'$  daily and eletriptan and sumatriptan injections as needed. She is aware not to exceed two total doses of a triptan in 24 hours or 9 doses per month. Healthy lifestyle habits encouraged. She will follow up with me in 1 year, sooner if needed.   No orders of the defined types were placed in this encounter.   No orders of the defined types were placed in this encounter.    Follow Up Instructions:  I discussed the assessment and treatment plan with the  patient. The patient was provided an opportunity to ask questions and all were answered. The patient agreed with the plan and demonstrated an understanding of the instructions.   The patient was advised to call back or seek an in-person evaluation if the symptoms worsen or if the condition fails to improve as anticipated.  I provided *** minutes of non-face-to-face time during this encounter. Patient located at their place of residence during [place] visit. Provider is in the office.    Debbora Presto, NP

## 2022-06-25 ENCOUNTER — Telehealth: Payer: BC Managed Care – PPO | Admitting: Family Medicine

## 2022-06-25 DIAGNOSIS — G43009 Migraine without aura, not intractable, without status migrainosus: Secondary | ICD-10-CM

## 2022-11-19 DIAGNOSIS — M7701 Medial epicondylitis, right elbow: Secondary | ICD-10-CM | POA: Diagnosis not present

## 2022-11-25 DIAGNOSIS — M7701 Medial epicondylitis, right elbow: Secondary | ICD-10-CM | POA: Diagnosis not present

## 2022-12-02 DIAGNOSIS — M7701 Medial epicondylitis, right elbow: Secondary | ICD-10-CM | POA: Diagnosis not present

## 2022-12-09 DIAGNOSIS — M7701 Medial epicondylitis, right elbow: Secondary | ICD-10-CM | POA: Diagnosis not present

## 2022-12-26 DIAGNOSIS — M7701 Medial epicondylitis, right elbow: Secondary | ICD-10-CM | POA: Diagnosis not present

## 2023-01-04 DIAGNOSIS — M25521 Pain in right elbow: Secondary | ICD-10-CM | POA: Diagnosis not present

## 2023-01-06 DIAGNOSIS — M7701 Medial epicondylitis, right elbow: Secondary | ICD-10-CM | POA: Diagnosis not present

## 2023-01-07 ENCOUNTER — Telehealth: Payer: Self-pay | Admitting: Neurology

## 2023-01-07 MED ORDER — QULIPTA 60 MG PO TABS: 60.0000 mg | ORAL_TABLET | Freq: Every day | ORAL | 3 refills | Status: DC

## 2023-01-07 NOTE — Telephone Encounter (Signed)
Pt called requested refill on  Atogepant (QULIPTA) 60 MG TABS. Should be sent to [facility]. Stated she will need another coupon code.

## 2023-01-08 ENCOUNTER — Other Ambulatory Visit: Payer: Self-pay | Admitting: Neurology

## 2023-01-09 ENCOUNTER — Other Ambulatory Visit: Payer: Self-pay | Admitting: Neurology

## 2023-01-09 NOTE — Telephone Encounter (Signed)
ALREADY SENT AND RECEIPT CONFIRMED BY PHARMACY: Outpatient Medication Detail   Disp Refills Start End   SUMAtriptan 6 MG/0.5ML SOAJ 4 mL 3 01/09/2023    Sig - Route: INJECT 6 MG INTO THE SKIN AS NEEDED. MAY REPEAT IN 2 HOURS. MAXIMUM 2X PER DAY. MAX 2 DAYS A WEEK. - Subcutaneous   Sent to pharmacy as: SUMAtriptan 6 MG/0.5ML Solution Auto-injector   E-Prescribing Status: Receipt confirmed by pharmacy (01/09/2023  8:49 AM EDT)

## 2023-01-20 DIAGNOSIS — M7701 Medial epicondylitis, right elbow: Secondary | ICD-10-CM | POA: Diagnosis not present

## 2023-01-28 DIAGNOSIS — M25521 Pain in right elbow: Secondary | ICD-10-CM | POA: Diagnosis not present

## 2023-01-31 DIAGNOSIS — M7701 Medial epicondylitis, right elbow: Secondary | ICD-10-CM | POA: Diagnosis not present

## 2023-02-03 DIAGNOSIS — M7701 Medial epicondylitis, right elbow: Secondary | ICD-10-CM | POA: Diagnosis not present

## 2023-02-12 DIAGNOSIS — M7701 Medial epicondylitis, right elbow: Secondary | ICD-10-CM | POA: Diagnosis not present

## 2023-02-26 DIAGNOSIS — M7701 Medial epicondylitis, right elbow: Secondary | ICD-10-CM | POA: Diagnosis not present

## 2023-02-28 DIAGNOSIS — Z01419 Encounter for gynecological examination (general) (routine) without abnormal findings: Secondary | ICD-10-CM | POA: Diagnosis not present

## 2023-02-28 DIAGNOSIS — Z1389 Encounter for screening for other disorder: Secondary | ICD-10-CM | POA: Diagnosis not present

## 2023-02-28 DIAGNOSIS — Z1231 Encounter for screening mammogram for malignant neoplasm of breast: Secondary | ICD-10-CM | POA: Diagnosis not present

## 2023-03-05 DIAGNOSIS — F419 Anxiety disorder, unspecified: Secondary | ICD-10-CM | POA: Diagnosis not present

## 2023-03-05 DIAGNOSIS — Z131 Encounter for screening for diabetes mellitus: Secondary | ICD-10-CM | POA: Diagnosis not present

## 2023-03-05 DIAGNOSIS — G43909 Migraine, unspecified, not intractable, without status migrainosus: Secondary | ICD-10-CM | POA: Diagnosis not present

## 2023-03-05 DIAGNOSIS — E782 Mixed hyperlipidemia: Secondary | ICD-10-CM | POA: Diagnosis not present

## 2023-03-05 DIAGNOSIS — E663 Overweight: Secondary | ICD-10-CM | POA: Diagnosis not present

## 2023-03-05 DIAGNOSIS — Z0000 Encounter for general adult medical examination without abnormal findings: Secondary | ICD-10-CM | POA: Diagnosis not present

## 2023-03-12 DIAGNOSIS — M7701 Medial epicondylitis, right elbow: Secondary | ICD-10-CM | POA: Diagnosis not present

## 2023-03-17 DIAGNOSIS — M7701 Medial epicondylitis, right elbow: Secondary | ICD-10-CM | POA: Diagnosis not present

## 2023-03-17 DIAGNOSIS — S86811A Strain of other muscle(s) and tendon(s) at lower leg level, right leg, initial encounter: Secondary | ICD-10-CM | POA: Diagnosis not present

## 2023-03-21 ENCOUNTER — Other Ambulatory Visit (HOSPITAL_COMMUNITY): Payer: Self-pay

## 2023-03-21 ENCOUNTER — Telehealth: Payer: Self-pay

## 2023-03-21 NOTE — Telephone Encounter (Signed)
Pharmacy Patient Advocate Encounter  Prior Authorization for Qulipta 60MG  tablets has been APPROVED by [employer] from 02/19/2023 to 03/20/2024.   PA # PA Case ID: [id number]

## 2023-03-21 NOTE — Telephone Encounter (Signed)
Pharmacy Patient Advocate Encounter   Received notification from [employer] that prior authorization for Qulipta 60MG  tablets is required/requested.   PA submitted to [employer] via CoverMyMeds Key or [facility]) confirmation # [id number] Status is pending

## 2023-03-24 ENCOUNTER — Telehealth: Payer: BC Managed Care – PPO | Admitting: Neurology

## 2023-03-25 DIAGNOSIS — R2231 Localized swelling, mass and lump, right upper limb: Secondary | ICD-10-CM | POA: Diagnosis not present

## 2023-03-26 DIAGNOSIS — M7701 Medial epicondylitis, right elbow: Secondary | ICD-10-CM | POA: Diagnosis not present

## 2023-06-19 DIAGNOSIS — L578 Other skin changes due to chronic exposure to nonionizing radiation: Secondary | ICD-10-CM | POA: Diagnosis not present

## 2023-06-19 DIAGNOSIS — D239 Other benign neoplasm of skin, unspecified: Secondary | ICD-10-CM | POA: Diagnosis not present

## 2023-06-19 DIAGNOSIS — L814 Other melanin hyperpigmentation: Secondary | ICD-10-CM | POA: Diagnosis not present

## 2023-06-19 DIAGNOSIS — L821 Other seborrheic keratosis: Secondary | ICD-10-CM | POA: Diagnosis not present

## 2023-06-20 DIAGNOSIS — K625 Hemorrhage of anus and rectum: Secondary | ICD-10-CM | POA: Diagnosis not present

## 2023-06-23 ENCOUNTER — Other Ambulatory Visit: Payer: Self-pay | Admitting: Neurology

## 2023-06-25 ENCOUNTER — Telehealth (INDEPENDENT_AMBULATORY_CARE_PROVIDER_SITE_OTHER): Payer: BC Managed Care – PPO | Admitting: Neurology

## 2023-06-25 DIAGNOSIS — G43109 Migraine with aura, not intractable, without status migrainosus: Secondary | ICD-10-CM | POA: Diagnosis not present

## 2023-06-25 DIAGNOSIS — G43009 Migraine without aura, not intractable, without status migrainosus: Secondary | ICD-10-CM

## 2023-06-25 NOTE — Progress Notes (Signed)
CC: Migraines   HISTORY OF PRESENT ILLNESS:  Virtual Visit via Video Note  I connected with Tricia Richards on 06/25/2023 at  7:30 AM EDT by a video enabled telemedicine application and verified that I am speaking with the correct person using two identifiers.  Location: Patient: home  Provider: office   I discussed the limitations of evaluation and management by telemedicine and the availability of in person appointments. The patient expressed understanding and agreed to proceed.   Follow Up Instructions:    I discussed the assessment and treatment plan with the patient. The patient was provided an opportunity to ask questions and all were answered. The patient agreed with the plan and demonstrated an understanding of the instructions.   The patient was advised to call back or seek an in-person evaluation if the symptoms worsen or if the condition fails to improve as anticipated.  I provided 10 minutes of non-face-to-face time during this encounter.   Anson Fret, MD  06/25/2023: She is doing great. Not even 1 migraine a week and her medications work. Her Qulipta approved until 9 /2025 will need a reminder to refill prior and have office call for follow up end of next year or sooner if needed.   Patient complains of symptoms per HPI as well as the following symptoms: none . Pertinent negatives and positives per HPI. All others negative   12/18/2021: Patient is here for yearly follow-up.  I initially saw her in 2020 and she was seen by my nurse practitioner Shawnie Dapper in February of 17th 2023.  At last appointment she felt she was doing well just with abortive therapy.  She tried Trokendi in the past but felt as though she had side effects, given she has about 3-4 migraines per month she usually takes eletriptan first and then sumatriptan injection as needed. She is having more migraines. She is having 6 migraine days a month, and 10 total headache days a month. We had a long  discussion about options, injections, qulipta, other oral meds such as below. Decided on qulipta. 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 Imitrex po and injection and eletriptan, rizatriptan.   Patient complains of symptoms per HPI as well as the following symptoms: migraines . Pertinent negatives and positives per HPI. All others negative  November 30, 2021 ALL:  Tricia Richards is a 57 y.o. female here today for follow up for migraines. She was seen as a new patient by Dr Lucia Gaskins 09/13/2019. She was started on Trokendi. Eletriptan and sumatriptan injections used for abortive therapy. She felt that she was not able to run and exercise like normal on Trokendi. She has continued abortive therapy only. She feels this works well for her. She has about 3-4 migraines per month. She usually takes eletriptan first and sumatriptan injection if needed. She continues to exercise regularly. She is followed closely by PCP.    HISTORY (copied from Dr Trevor Mace previous note)  HPI:  Tricia Richards is a 57 y.o. female here as requested by Joycelyn Rua, MD for migraines. 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 She has a long history of migraines. She was on Topamax in the  past which very much helped however she discontinued due to hair loss. But migraines are worsening. She has started having more headaches. Several migraines a month. In the last year. Pounding, usually behind the left eye, can spread, light sensitivity, sound sensitivity, nausea, no recent vomiting.  She can wake with one and the injection works, she may use the relpax if less acute. 15 headache days a month and 8-12 of those are migraineous and moderately severe or severe. Weather is a huge trigger. Menses alos make it worse. No food triggers. Brother has migraines. She just had labs and she declines any imaging, headaches are usual, no changes in severity or quality, no new symptoms. Started at the age of 62. Wasn't diagnosed until 2000. No other focal neurologic deficits, associated symptoms, inciting events or modifiable factors.   Reviewed notes, labs and imaging from outside physicians, which showed:   I reviewed notes from a the [facility] for neurologic disorders.  MRI of the brain April 19, 2009 showed signal changes in the left temporal bone, left internal auditory canal, and adjacent portion of the left temporal lobe postsurgical changes, no recurrent or residual enhancing neoplasm within the left internal auditory canal, solitary focus of high T2 weighted signal above the left lateral ventricle is nonspecific and is of unlikely significance, MRI of the brain is otherwise normal, reviewed report.   MRI showed normal MRI without evidence for aneurysm, significant stenosis or vascular malformation.   I reviewed office notes, she was on Topamax in the past, she lost weight, weight improved off of Topamax, patient however was unhappy with her weight gain which may have been Zoloft related but Zoloft appeared to be helping with her mood disorder specifically her anxiety.  At the time per headaches were twice a week, unchanged, taking Axert which works at least 50% of the time.  They continued Zoloft.  They reintroduced Topamax at a low dose of 25 mg twice daily with plans to titrate higher.  Other than Axert she also uses injectable Imitrex.  Dictated in February 2008.   On subsequent visits she reported 3 migraines per month, can last less than an hour treated with a triptan, Relpax in the  past was used as well.  In the past she had stopped Topamax due to a hair loss.  She was doing well without the medications for some time.  She resected her left-sided vestibular schwannoma.  Also hearing aid for left-sided deafness.  Prior notes reviewed showed that max dose on Topamax was 50 mg twice daily, she did have a remote history of kidney stones no problems taking Topamax.  She is also had IV infusions in the past with Toradol, Depacon and Solu-Medrol for severe headache acutely, and also Medrol Dosepaks for flareups and migraines and headaches.    REVIEW OF SYSTEMS: Out of a complete 14 system review of symptoms, the patient complains only of the following symptoms, headaches and all other reviewed systems are negative.    ALLERGIES: Allergies  Allergen Reactions   Bactrim [Sulfamethoxazole-Trimethoprim] Rash     HOME MEDICATIONS: Outpatient Medications Prior to Visit  Medication Sig Dispense Refill   Calcium Citrate-Vitamin D (CALCIUM CITRATE+D3 PETITES PO) Take by mouth.     eletriptan (RELPAX) 40 MG tablet TAKE 1 TABLET BY MOUTH AT ONSET OF HEADACHE. MAY REPEAT AFTER 2 HOURS *MAX 2TABS/24 HOURS** 10 tablet 3   sertraline (ZOLOFT) 50 MG tablet Take 75 mg by mouth daily.     SUMAtriptan 6 MG/0.5ML  SOAJ INJECT 6 MG INTO THE SKIN AS NEEDED. MAY REPEAT IN 2 HOURS. MAXIMUM 2X PER DAY. MAX 2 DAYS A WEEK. 4 mL 3   Atogepant (QULIPTA) 60 MG TABS Take 1 tablet (60 mg total) by mouth daily. 30 tablet 3   No facility-administered medications prior to visit.     PAST MEDICAL HISTORY: Past Medical History:  Diagnosis Date   Brain tumor [facility])    acoustic neuroma, benign. Left   Cataracts, bilateral    Deafness    left   Kidney stones    Migraine    Migraine headache without aura    Migraine with aura      PAST SURGICAL HISTORY: Past Surgical History:  Procedure Laterality Date   ACOUSTIC NEUROMA RESECTION  11/2001   tummy tuck       FAMILY HISTORY: Family History   Problem Relation Age of Onset   Depression Mother    Diabetes Father    Hypertension Father    Heart disease Father    Migraines Neg Hx      SOCIAL HISTORY: Social History   Socioeconomic History   Marital status: Married    Spouse name: Not on file   Number of children: 0   Years of education: Not on file   Highest education level: Associate degree: academic program  Occupational History   Not on file  Tobacco Use   Smoking status: Former    Current packs/day: 0.00    Types: Cigarettes    Quit date: 1995    Years since quitting: 29.7   Smokeless tobacco: Never   Tobacco comments:    1 pack a week for 5-6 years  Vaping Use   Vaping status: Never Used  Substance and Sexual Activity   Alcohol use: Yes    Comment: occasionally    Drug use: Never   Sexual activity: Not on file  Other Topics Concern   Not on file  Social History Narrative   Lives at home with her husband   Right handed   Caffeine: coffee 2 cups/day, energy powder mix-in (C4) every morning   Social Determinants of Health   Financial Resource Strain: Not on file  Food Insecurity: Not on file  Transportation Needs: Not on file  Physical Activity: Not on file  Stress: Not on file  Social Connections: Not on file  Intimate Partner Violence: Not on file     Physical exam: Exam: Gen: NAD, conversant      CV:  Denies palpitations or chest pain or SOB. VS: Breathing at a normal rate. Weight appears within normal limits. Not febrile. Eyes: Conjunctivae clear without exudates or hemorrhage  Neuro: Detailed Neurologic Exam  Speech:    Speech is normal; fluent and spontaneous with normal comprehension.  Cognition:    The patient is oriented to person, place, and time;     recent and remote memory intact;     language fluent;     normal attention, concentration,     fund of knowledge Cranial Nerves:    The pupils are equal, round, and reactive to light. Visual fields are full. Extraocular  movements are intact.  The face is symmetric with normal sensation. The palate elevates in the midline. Hearing intact. Voice is normal. Shoulder shrug is normal. The tongue has normal motion without fasciculations.   Coordination:    Normal finger to nose  Gait:    Normal native gait  Motor Observation:   no involuntary movements noted. Tone:  Appears normal  Posture:    Posture is normal. normal erect    Strength:    Strength is anti-gravity and symmetric in the upper limbs.              DIAGNOSTIC DATA (LABS, IMAGING, TESTING) - I reviewed patient records, labs, notes, testing and imaging myself where available.  Lab Results  Component Value Date   WBC 7.3 11/23/2019   HGB 13.3 11/23/2019   HCT 39.7 11/23/2019   MCV 88 11/23/2019   PLT 293 11/23/2019      Component Value Date/Time   NA 141 11/23/2019 1031   K 4.1 11/23/2019 1031   CL 107 (H) 11/23/2019 1031   CO2 22 11/23/2019 1031   GLUCOSE 80 11/23/2019 1031   BUN 16 11/23/2019 1031   CREATININE 1.11 (H) 11/23/2019 1031   CALCIUM 9.5 11/23/2019 1031   PROT 6.7 11/23/2019 1031   ALBUMIN 4.0 11/23/2019 1031   AST 16 11/23/2019 1031   ALT 9 11/23/2019 1031   ALKPHOS 50 11/23/2019 1031   BILITOT 0.2 11/23/2019 1031   GFRNONAA 56 (L) 11/23/2019 1031   GFRAA 65 11/23/2019 1031   No results found for: "CHOL", "HDL", "LDLCALC", "LDLDIRECT", "TRIG", "CHOLHDL" No results found for: "HGBA1C" No results found for: "VITAMINB12" No results found for: "TSH"      No data to display               No data to display           ASSESSMENT AND PLAN  57 y.o. year old female  has a past medical history of Brain tumor (HCC), Cataracts, bilateral, Deafness, Kidney stones, Migraine, Migraine headache without aura, and Migraine with aura. here with Episodic migraines  Migraine without aura and without status migrainosus, not intractable  Migraine with aura and without status migrainosus, not  intractable  She is having 6 migraine days a month, and 10 total headache days a month. We had a long discussion about options, injections, qulipta, other oral meds such as below. Decided on qulipta. Doing extremely well.  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 Imitrex po and injection and eletriptan, rizatriptan.   Have discussed risk of stroke in women with migraine with aura Signed out samples, placed them up front in a bag with prescription for patient Sent email for office to call and schedule her for 6 month follow up.   Naomie Dean MD [facility] [address], [address] [place], [place] [address] [phone]

## 2023-06-28 ENCOUNTER — Encounter: Payer: Self-pay | Admitting: Neurology

## 2023-08-08 ENCOUNTER — Encounter (HOSPITAL_COMMUNITY)
Admission: EM | Disposition: A | Discharge status: Home / Self Care | Payer: Self-pay | Source: Home / Self Care | Attending: Emergency Medicine

## 2023-08-08 ENCOUNTER — Encounter (HOSPITAL_BASED_OUTPATIENT_CLINIC_OR_DEPARTMENT_OTHER): Payer: Self-pay | Admitting: *Deleted

## 2023-08-08 ENCOUNTER — Emergency Department (HOSPITAL_BASED_OUTPATIENT_CLINIC_OR_DEPARTMENT_OTHER): Payer: BC Managed Care – PPO

## 2023-08-08 ENCOUNTER — Emergency Department (HOSPITAL_COMMUNITY): Payer: BC Managed Care – PPO | Admitting: Anesthesiology

## 2023-08-08 ENCOUNTER — Other Ambulatory Visit: Payer: Self-pay

## 2023-08-08 ENCOUNTER — Observation Stay (HOSPITAL_BASED_OUTPATIENT_CLINIC_OR_DEPARTMENT_OTHER)
Admission: EM | Admit: 2023-08-08 | Discharge: 2023-08-10 | Disposition: A | Discharge status: Home / Self Care | Payer: BC Managed Care – PPO | Attending: Surgery | Admitting: Surgery

## 2023-08-08 DIAGNOSIS — R1031 Right lower quadrant pain: Secondary | ICD-10-CM | POA: Diagnosis not present

## 2023-08-08 DIAGNOSIS — R000 Tachycardia, unspecified: Secondary | ICD-10-CM | POA: Diagnosis not present

## 2023-08-08 DIAGNOSIS — Z87891 Personal history of nicotine dependence: Secondary | ICD-10-CM | POA: Diagnosis not present

## 2023-08-08 DIAGNOSIS — K3580 Unspecified acute appendicitis: Principal | ICD-10-CM | POA: Insufficient documentation

## 2023-08-08 DIAGNOSIS — K37 Unspecified appendicitis: Secondary | ICD-10-CM | POA: Diagnosis not present

## 2023-08-08 DIAGNOSIS — Z9049 Acquired absence of other specified parts of digestive tract: Secondary | ICD-10-CM

## 2023-08-08 DIAGNOSIS — K353 Acute appendicitis with localized peritonitis, without perforation or gangrene: Secondary | ICD-10-CM | POA: Diagnosis not present

## 2023-08-08 HISTORY — PX: LAPAROSCOPIC APPENDECTOMY: SHX408

## 2023-08-08 LAB — URINALYSIS, ROUTINE W REFLEX MICROSCOPIC
Bilirubin Urine: NEGATIVE
Glucose, UA: NEGATIVE mg/dL
Hgb urine dipstick: NEGATIVE
Ketones, ur: NEGATIVE mg/dL
Leukocytes,Ua: NEGATIVE
Nitrite: NEGATIVE
Specific Gravity, Urine: 1.029 (ref 1.005–1.030)
pH: 6 (ref 5.0–8.0)

## 2023-08-08 LAB — COMPREHENSIVE METABOLIC PANEL
ALT: 6 U/L (ref 0–44)
AST: 13 U/L — ABNORMAL LOW (ref 15–41)
Albumin: 4.6 g/dL (ref 3.5–5.0)
Alkaline Phosphatase: 58 U/L (ref 38–126)
Anion gap: 7 (ref 5–15)
BUN: 14 mg/dL (ref 6–20)
CO2: 29 mmol/L (ref 22–32)
Calcium: 10.3 mg/dL (ref 8.9–10.3)
Chloride: 102 mmol/L (ref 98–111)
Creatinine, Ser: 0.94 mg/dL (ref 0.44–1.00)
GFR, Estimated: >60 mL/min (ref >60)
Glucose, Bld: 88 mg/dL (ref 70–99)
Potassium: 4.2 mmol/L (ref 3.5–5.1)
Sodium: 138 mmol/L (ref 135–145)
Total Bilirubin: 0.6 mg/dL (ref 0.3–1.2)
Total Protein: 7.6 g/dL (ref 6.5–8.1)

## 2023-08-08 LAB — CBC
HCT: 44.4 % (ref 36.0–46.0)
Hemoglobin: 14.5 g/dL (ref 12.0–15.0)
MCH: 28.9 pg (ref 26.0–34.0)
MCHC: 32.7 g/dL (ref 30.0–36.0)
MCV: 88.6 fL (ref 80.0–100.0)
Platelets: 231 10*3/uL (ref 150–400)
RBC: 5.01 MIL/uL (ref 3.87–5.11)
RDW: 12.7 % (ref 11.5–15.5)
WBC: 7 10*3/uL (ref 4.0–10.5)
nRBC: 0 % (ref 0.0–0.2)

## 2023-08-08 LAB — LIPASE, BLOOD: Lipase: 20 U/L (ref 11–51)

## 2023-08-08 SURGERY — APPENDECTOMY, LAPAROSCOPIC
Anesthesia: General | Site: Abdomen

## 2023-08-08 MED ORDER — PIPERACILLIN-TAZOBACTAM 3.375 G IVPB 30 MIN
3.3750 g | Freq: Once | INTRAVENOUS | Status: AC
  Administered 2023-08-08: 3.375 g via INTRAVENOUS
  Filled 2023-08-08: qty 50

## 2023-08-08 MED ORDER — FENTANYL CITRATE (PF) 250 MCG/5ML IJ SOLN
INTRAMUSCULAR | Status: AC
  Filled 2023-08-08: qty 5

## 2023-08-08 MED ORDER — BUPIVACAINE-EPINEPHRINE 0.25% -1:200000 IJ SOLN
INTRAMUSCULAR | Status: DC | PRN
  Administered 2023-08-08: 15 mL

## 2023-08-08 MED ORDER — FENTANYL CITRATE (PF) 100 MCG/2ML IJ SOLN
25.0000 ug | INTRAMUSCULAR | Status: DC | PRN
  Administered 2023-08-08 (×3): 50 ug via INTRAVENOUS

## 2023-08-08 MED ORDER — ACETAMINOPHEN 160 MG/5ML PO SOLN: 1000.0000 mg | Freq: Once | ORAL | Status: DC | PRN

## 2023-08-08 MED ORDER — DEXAMETHASONE SODIUM PHOSPHATE 10 MG/ML IJ SOLN
INTRAMUSCULAR | Status: DC | PRN
  Administered 2023-08-08: 10 mg via INTRAVENOUS

## 2023-08-08 MED ORDER — ACETAMINOPHEN 10 MG/ML IV SOLN
INTRAVENOUS | Status: AC
  Filled 2023-08-08: qty 100

## 2023-08-08 MED ORDER — OXYCODONE-ACETAMINOPHEN 5-325 MG PO TABS: 1.0000 | ORAL_TABLET | Freq: Once | ORAL | Status: DC

## 2023-08-08 MED ORDER — ONDANSETRON HCL 4 MG/2ML IJ SOLN
INTRAMUSCULAR | Status: DC | PRN
  Administered 2023-08-08: 4 mg via INTRAVENOUS

## 2023-08-08 MED ORDER — PROPOFOL 10 MG/ML IV BOLUS
INTRAVENOUS | Status: DC | PRN
  Administered 2023-08-08: 140 mg via INTRAVENOUS

## 2023-08-08 MED ORDER — MORPHINE SULFATE (PF) 4 MG/ML IV SOLN
4.0000 mg | Freq: Once | INTRAVENOUS | Status: DC
Start: 2023-08-08 — End: 2023-08-09

## 2023-08-08 MED ORDER — PHENYLEPHRINE 80 MCG/ML (10ML) SYRINGE FOR IV PUSH (FOR BLOOD PRESSURE SUPPORT)
PREFILLED_SYRINGE | INTRAVENOUS | Status: DC | PRN
  Administered 2023-08-08 (×2): 80 ug via INTRAVENOUS

## 2023-08-08 MED ORDER — MIDAZOLAM HCL 2 MG/2ML IJ SOLN
INTRAMUSCULAR | Status: DC | PRN
  Administered 2023-08-08: 2 mg via INTRAVENOUS

## 2023-08-08 MED ORDER — FENTANYL CITRATE (PF) 100 MCG/2ML IJ SOLN
INTRAMUSCULAR | Status: AC
  Filled 2023-08-08: qty 2

## 2023-08-08 MED ORDER — IOHEXOL 300 MG/ML  SOLN
100.0000 mL | Freq: Once | INTRAMUSCULAR | Status: AC | PRN
  Administered 2023-08-08: 100 mL via INTRAVENOUS

## 2023-08-08 MED ORDER — ROCURONIUM BROMIDE 10 MG/ML (PF) SYRINGE
PREFILLED_SYRINGE | INTRAVENOUS | Status: DC | PRN
  Administered 2023-08-08: 10 mg via INTRAVENOUS
  Administered 2023-08-08: 50 mg via INTRAVENOUS
  Administered 2023-08-08: 5 mg via INTRAVENOUS

## 2023-08-08 MED ORDER — ACETAMINOPHEN 500 MG PO TABS: 1000.0000 mg | ORAL_TABLET | Freq: Once | ORAL | Status: DC | PRN

## 2023-08-08 MED ORDER — ONDANSETRON HCL 4 MG/2ML IJ SOLN: 4.0000 mg | Freq: Once | INTRAMUSCULAR | Status: DC

## 2023-08-08 MED ORDER — PIPERACILLIN-TAZOBACTAM 3.375 G IVPB 30 MIN: 3.3750 g | Freq: Once | INTRAVENOUS | Status: DC

## 2023-08-08 MED ORDER — 0.9 % SODIUM CHLORIDE (POUR BTL) OPTIME
TOPICAL | Status: DC | PRN
  Administered 2023-08-08: 1000 mL

## 2023-08-08 MED ORDER — PIPERACILLIN-TAZOBACTAM 3.375 G IVPB: 3.3750 g | Freq: Three times a day (TID) | INTRAVENOUS | Status: DC

## 2023-08-08 MED ORDER — LIDOCAINE 2% (20 MG/ML) 5 ML SYRINGE
INTRAMUSCULAR | Status: DC | PRN
  Administered 2023-08-08: 50 mg via INTRAVENOUS

## 2023-08-08 MED ORDER — MIDAZOLAM HCL 2 MG/2ML IJ SOLN
INTRAMUSCULAR | Status: AC
  Filled 2023-08-08: qty 2

## 2023-08-08 MED ORDER — SODIUM CHLORIDE 0.9 % IV SOLN: INTRAVENOUS | Status: DC | PRN

## 2023-08-08 MED ORDER — SUGAMMADEX SODIUM 200 MG/2ML IV SOLN
INTRAVENOUS | Status: DC | PRN
  Administered 2023-08-08: 200 mg via INTRAVENOUS

## 2023-08-08 MED ORDER — PROPOFOL 10 MG/ML IV BOLUS
INTRAVENOUS | Status: AC
  Filled 2023-08-08: qty 20

## 2023-08-08 MED ORDER — BUPIVACAINE-EPINEPHRINE (PF) 0.25% -1:200000 IJ SOLN
INTRAMUSCULAR | Status: AC
  Filled 2023-08-08: qty 30

## 2023-08-08 MED ORDER — ACETAMINOPHEN 10 MG/ML IV SOLN
1000.0000 mg | Freq: Once | INTRAVENOUS | Status: DC | PRN
  Administered 2023-08-08: 1000 mg via INTRAVENOUS

## 2023-08-08 MED ORDER — FENTANYL CITRATE (PF) 250 MCG/5ML IJ SOLN
INTRAMUSCULAR | Status: DC | PRN
  Administered 2023-08-08: 100 ug via INTRAVENOUS
  Administered 2023-08-08: 50 ug via INTRAVENOUS

## 2023-08-08 MED ORDER — OXYCODONE HCL 5 MG PO TABS: 5.0000 mg | ORAL_TABLET | Freq: Once | ORAL | Status: DC | PRN

## 2023-08-08 MED ORDER — OXYCODONE HCL 5 MG/5ML PO SOLN: 5.0000 mg | Freq: Once | ORAL | Status: DC | PRN

## 2023-08-08 MED ORDER — SODIUM CHLORIDE 0.9 % IR SOLN
Status: DC | PRN
  Administered 2023-08-08: 1000 mL

## 2023-08-08 SURGICAL SUPPLY — 49 items
ADH SKN CLS APL DERMABOND .7 (GAUZE/BANDAGES/DRESSINGS) ×1
APL PRP STRL LF DISP 70% ISPRP (MISCELLANEOUS) ×1
APPLIER CLIP 5 13 M/L LIGAMAX5 (MISCELLANEOUS)
APR CLP MED LRG 5 ANG JAW (MISCELLANEOUS)
BAG COUNTER SPONGE SURGICOUNT (BAG) ×1 IMPLANT
BAG SPNG CNTER NS LX DISP (BAG) ×1
BLADE CLIPPER SURG (BLADE) IMPLANT
CANISTER SUCT 3000ML PPV (MISCELLANEOUS) ×1 IMPLANT
CHLORAPREP W/TINT 26 (MISCELLANEOUS) ×1 IMPLANT
CLIP APPLIE 5 13 M/L LIGAMAX5 (MISCELLANEOUS) IMPLANT
COVER SURGICAL LIGHT HANDLE (MISCELLANEOUS) ×1 IMPLANT
CUTTER FLEX LINEAR 45M (STAPLE) ×1 IMPLANT
DERMABOND ADVANCED .7 DNX12 (GAUZE/BANDAGES/DRESSINGS) ×1 IMPLANT
ELECT REM PT RETURN 9FT ADLT (ELECTROSURGICAL) ×1
ELECTRODE REM PT RTRN 9FT ADLT (ELECTROSURGICAL) ×1 IMPLANT
GLOVE BIO SURGEON STRL SZ7.5 (GLOVE) ×1 IMPLANT
GLOVE INDICATOR 8.0 STRL GRN (GLOVE) ×1 IMPLANT
GOWN STRL REUS W/ TWL LRG LVL3 (GOWN DISPOSABLE) ×2 IMPLANT
GOWN STRL REUS W/ TWL XL LVL3 (GOWN DISPOSABLE) ×1 IMPLANT
GOWN STRL REUS W/TWL LRG LVL3 (GOWN DISPOSABLE) ×2
GOWN STRL REUS W/TWL XL LVL3 (GOWN DISPOSABLE) ×1
IRRIG SUCT STRYKERFLOW 2 WTIP (MISCELLANEOUS) ×1
IRRIGATION SUCT STRKRFLW 2 WTP (MISCELLANEOUS) ×1 IMPLANT
KIT BASIN OR (CUSTOM PROCEDURE TRAY) ×1 IMPLANT
KIT TURNOVER KIT B (KITS) ×1 IMPLANT
NS IRRIG 1000ML POUR BTL (IV SOLUTION) ×1 IMPLANT
PAD ARMBOARD 7.5X6 YLW CONV (MISCELLANEOUS) ×2 IMPLANT
PENCIL SMOKE EVACUATOR (MISCELLANEOUS) ×1 IMPLANT
RELOAD 45 VASCULAR/THIN (ENDOMECHANICALS) IMPLANT
RELOAD STAPLE 45 2.5 WHT GRN (ENDOMECHANICALS) IMPLANT
RELOAD STAPLE 45 3.5 BLU ETS (ENDOMECHANICALS) IMPLANT
RELOAD STAPLE TA45 3.5 REG BLU (ENDOMECHANICALS) IMPLANT
SCISSORS LAP 5X35 DISP (ENDOMECHANICALS) IMPLANT
SET TUBE SMOKE EVAC HIGH FLOW (TUBING) ×1 IMPLANT
SHEARS HARMONIC ACE PLUS 36CM (ENDOMECHANICALS) ×1 IMPLANT
SLEEVE ADV FIXATION 5X100MM (TROCAR) ×1 IMPLANT
SPECIMEN JAR SMALL (MISCELLANEOUS) ×1 IMPLANT
SUT MNCRL AB 4-0 PS2 18 (SUTURE) ×1 IMPLANT
SYS BAG RETRIEVAL 10MM (BASKET) ×1
SYSTEM BAG RETRIEVAL 10MM (BASKET) ×1 IMPLANT
TOWEL GREEN STERILE (TOWEL DISPOSABLE) ×1 IMPLANT
TOWEL GREEN STERILE FF (TOWEL DISPOSABLE) ×1 IMPLANT
TRAY FOLEY W/BAG SLVR 16FR (SET/KITS/TRAYS/PACK) ×1
TRAY FOLEY W/BAG SLVR 16FR ST (SET/KITS/TRAYS/PACK) ×1 IMPLANT
TRAY LAPAROSCOPIC MC (CUSTOM PROCEDURE TRAY) ×1 IMPLANT
TROCAR ADV FIXATION 5X100MM (TROCAR) ×1 IMPLANT
TROCAR BALLN 12MMX100 BLUNT (TROCAR) ×1 IMPLANT
WARMER LAPAROSCOPE (MISCELLANEOUS) ×1 IMPLANT
WATER STERILE IRR 1000ML POUR (IV SOLUTION) ×1 IMPLANT

## 2023-08-08 NOTE — Anesthesia Preprocedure Evaluation (Signed)
Anesthesia Evaluation  Patient identified by MRN, date of birth, ID band Patient awake    Reviewed: Allergy & Precautions, NPO status , Patient's Chart, lab work & pertinent test results  History of Anesthesia Complications Negative for: history of anesthetic complications  Airway Mallampati: II  TM Distance: >3 FB Neck ROM: Full    Dental  (+) Teeth Intact, Dental Advisory Given   Pulmonary neg shortness of breath, neg sleep apnea, neg COPD, neg recent URI, former smoker   breath sounds clear to auscultation       Cardiovascular negative cardio ROS  Rhythm:Regular     Neuro/Psych  Headaches  Neuromuscular disease  negative psych ROS   GI/Hepatic Neg liver ROS,,,appendicitis   Endo/Other  negative endocrine ROS    Renal/GU Renal disease     Musculoskeletal   Abdominal   Peds  Hematology negative hematology ROS (+)   Anesthesia Other Findings   Reproductive/Obstetrics                              Anesthesia Physical Anesthesia Plan  ASA: 2  Anesthesia Plan: General   Post-op Pain Management:    Induction: Intravenous  PONV Risk Score and Plan: 3 and Ondansetron and Dexamethasone  Airway Management Planned: Oral ETT  Additional Equipment: None  Intra-op Plan:   Post-operative Plan: Extubation in OR  Informed Consent: I have reviewed the patients History and Physical, chart, labs and discussed the procedure including the risks, benefits and alternatives for the proposed anesthesia with the patient or authorized representative who has indicated his/her understanding and acceptance.     Dental advisory given  Plan Discussed with: CRNA  Anesthesia Plan Comments:          Anesthesia Quick Evaluation

## 2023-08-08 NOTE — ED Provider Notes (Signed)
[facility] Provider Note   CSN: [id number] Arrival date & time: 08/08/23  1343     History  Chief Complaint  Patient presents with   Abdominal Pain    Tricia Richards is a 57 y.o. female with PMHx brain tumor, kidney stones, migraine who presents to ED concerned for RLQ x3 days. Reports hx of ovarian cysts and endometriosis but states that she has not had a cyst in 2 years after menopause. Last BM 3 days ago which patient states is normal for her. States that the pain is "throbbing" and constant. States that it hurts to press on.   Denies fever, chest pain, dyspnea, cough, nausea, vomiting, diarrhea, dysuria, hematuria, hematochezia.    Abdominal Pain      Home Medications Prior to Admission medications   Medication Sig Start Date End Date Taking? Authorizing Provider  Calcium Citrate-Vitamin D (CALCIUM CITRATE+D3 PETITES PO) Take by mouth.    [provider]  eletriptan (RELPAX) 40 MG tablet TAKE 1 TABLET BY MOUTH AT ONSET OF HEADACHE. MAY REPEAT AFTER 2 HOURS *MAX 2TABS/24 HOURS** 01/09/23   Anson Fret, MD  QULIPTA 60 MG TABS TAKE ONE (1) TABLET (60 MG TOTAL) BY MOUTH DAILY. 06/25/23   Anson Fret, MD  sertraline (ZOLOFT) 50 MG tablet Take 75 mg by mouth daily. 09/01/19   [provider]  SUMAtriptan 6 MG/0.5ML SOAJ INJECT 6 MG INTO THE SKIN AS NEEDED. MAY REPEAT IN 2 HOURS. MAXIMUM 2X PER DAY. MAX 2 DAYS A WEEK. 01/09/23   Anson Fret, MD      Allergies    Bactrim [sulfamethoxazole-trimethoprim]    Review of Systems   Review of Systems  Gastrointestinal:  Positive for abdominal pain.    Physical Exam Updated Vital Signs BP 117/81   Pulse 85   Temp 98.4 F (36.9 C) (Oral)   Resp 18   LMP 07/12/2012   SpO2 100%  Physical Exam Vitals and nursing note reviewed.  Constitutional:      General: She is not in acute distress.    Appearance: She is not ill-appearing or toxic-appearing.   HENT:     Head: Normocephalic and atraumatic.     Mouth/Throat:     Mouth: Mucous membranes are moist.     Pharynx: No posterior oropharyngeal erythema.  Eyes:     General: No scleral icterus.       Right eye: No discharge.        Left eye: No discharge.     Conjunctiva/sclera: Conjunctivae normal.  Cardiovascular:     Rate and Rhythm: Normal rate and regular rhythm.     Pulses: Normal pulses.     Heart sounds: Normal heart sounds. No murmur heard. Pulmonary:     Effort: Pulmonary effort is normal.  Abdominal:     General: Abdomen is flat. Bowel sounds are normal. There is no distension.     Palpations: Abdomen is soft. There is no mass.     Tenderness: There is abdominal tenderness in the right lower quadrant.  Musculoskeletal:     Right lower leg: No edema.     Left lower leg: No edema.  Skin:    General: Skin is warm and dry.     Findings: No rash.  Neurological:     General: No focal deficit present.     Mental Status: She is alert. Mental status is at baseline.  Psychiatric:        Mood  and Affect: Mood normal.        Behavior: Behavior normal.     ED Results / Procedures / Treatments   Labs (all labs ordered are listed, but only abnormal results are displayed) Labs Reviewed  COMPREHENSIVE METABOLIC PANEL - Abnormal; Notable for the following components:      Result Value   AST 13 (*)    All other components within normal limits  URINALYSIS, ROUTINE W REFLEX MICROSCOPIC - Abnormal; Notable for the following components:   Protein, ur TRACE (*)    All other components within normal limits  LIPASE, BLOOD  CBC    EKG None  Radiology CT ABDOMEN PELVIS W CONTRAST  Result Date: 08/08/2023 CLINICAL DATA:  Right lower quadrant pain EXAM: CT ABDOMEN AND PELVIS WITH CONTRAST TECHNIQUE: Multidetector CT imaging of the abdomen and pelvis was performed using the standard protocol following bolus administration of intravenous contrast. RADIATION DOSE REDUCTION: This  exam was performed according to the departmental dose-optimization program which includes automated exposure control, adjustment of the mA and/or kV according to patient size and/or use of iterative reconstruction technique. CONTRAST:  OMNIPAQUE IOHEXOL 300 MG/ML  SOLN COMPARISON:  None Available. FINDINGS: Lower chest: No acute abnormality Hepatobiliary: No focal hepatic abnormality. Gallbladder unremarkable. Pancreas: No focal abnormality or ductal dilatation. Spleen: No focal abnormality.  Normal size. Adrenals/Urinary Tract: Small scattered subcentimeter hypodensities throughout the kidneys most compatible with cysts. No follow-up imaging recommended. No stones or hydronephrosis. Adrenal glands and urinary bladder unremarkable. Stomach/Bowel: Retrocecal appendix which is dilated measuring up to 13 mm. Multiple appendicoliths throughout the lumen. Surrounding inflammation. Findings compatible with acute appendicitis. Stomach, large and small bowel grossly unremarkable. Vascular/Lymphatic: No evidence of aneurysm or adenopathy. Scattered aortic calcifications. Reproductive: Uterus and adnexa unremarkable.  No mass. Other: Small amount of free fluid in the pelvis.  No free air. Musculoskeletal: No acute bony abnormality. IMPRESSION: Dilated, inflamed retrocecal appendix with multiple appendicoliths. Findings compatible with acute appendicitis. Small amount of free fluid in the pelvis. These results were called by telephone at the time of interpretation on 08/08/2023 at 7:10 pm to provider Dr. Theresia Lo, who verbally acknowledged these results. Electronically Signed   By: Charlett Nose M.D.   On: 08/08/2023 19:12    Procedures .Critical Care  Performed by: Dorthy Cooler, PA-C Authorized by: Dorthy Cooler, PA-C   Critical care provider statement:    Critical care time (minutes):  30   Critical care was necessary to treat or prevent imminent or life-threatening deterioration of the following  conditions: Appendicitis.   Critical care was time spent personally by me on the following activities:  Development of treatment plan with patient or surrogate, discussions with consultants, evaluation of patient's response to treatment, examination of patient, ordering and review of laboratory studies, ordering and review of radiographic studies, ordering and performing treatments and interventions, pulse oximetry, re-evaluation of patient's condition and review of old charts   I assumed direction of critical care for this patient from another provider in my specialty: yes     Care discussed with: accepting provider at another facility   Comments:     Acute appendicitis - started patient on Zosyn.     Medications Ordered in ED Medications  piperacillin-tazobactam (ZOSYN) IVPB 3.375 g (has no administration in time range)    Followed by  piperacillin-tazobactam (ZOSYN) IVPB 3.375 g (has no administration in time range)  iohexol (OMNIPAQUE) 300 MG/ML solution 100 mL (100 mLs Intravenous Contrast Given 08/08/23 1723)  ED Course/ Medical Decision Making/ A&P                                 Medical Decision Making Amount and/or Complexity of Data Reviewed Labs: ordered. Radiology: ordered.  Risk Prescription drug management.    This patient presents to the ED for concern of abdominal pain, this involves an extensive number of treatment options, and is a complaint that carries with it a high risk of complications and morbidity.  The differential diagnosis includes gastroenteritis, colitis, small bowel obstruction, appendicitis, cholecystitis, pancreatitis, nephrolithiasis, UTI, pyelonephritis   Co morbidities that complicate the patient evaluation  brain tumor, kidney stones, migraine   Additional history obtained:  Dr. Lenise Arena PCP   Lab Tests:  I Ordered, and personally interpreted labs.  The pertinent results include: CBC with differential: No concern for anemia or  leukocytosis CMP: no concern for electrolyte abnormality; no concern for kidney/liver damage Lipase: within normal limits UA: not concerning for infection   Imaging Studies ordered:  I ordered imaging studies including  -CT Abd/Pelvis with contrast: evaluate for structural/surgical etiology of patients' severe abdominal pain.  I independently visualized and interpreted imaging I agree with the radiologist interpretation    Problem List / ED Course / Critical interventions / Medication management  Patient presented for RLQ abdominal pain.  Physical exam with tenderness to palpation of RLQ.  Patient is afebrile with stable vitals.  No other infectious symptoms today. Rest of physical exam reassuring. CMP reassuring.  Lipase within normal limits.  CBC without leukocytosis or anemia.  UA without concern for infection. CT concerning for acute appendicitis. Started patient on Zosyn. Consulted with Dr. Cliffton Asters (General surgery) who would like patient transferred to [facility] ED for evaluation.  Secured-chatted providers at [facility]. Dr. Jacqulyn Bath accepting provider.  I have reviewed the patients home medicines and have made adjustments as needed   Ddx: These are considered less likely due to history of present illness and physical exam. -gastroenteritis: No vomiting in ED; no fever; tolerating PO intake -colitis: Denies diarrhea, patient afebrile  -small bowel obstruction: CT without concern  -cholecystitis: Negative Murphy sign; liver enzymes within normal limits  -pancreatitis: No LUQ tenderness to palpation, lipase within normal limits  -nephrolithiasis: Denies flank pain and urinary complaints  -UTI/pyelonephritis: Denies urinary complaints    Social Determinants of Health:  none          Final Clinical Impression(s) / ED Diagnoses Final diagnoses:  Acute appendicitis, unspecified acute appendicitis type    Rx / DC Orders ED Discharge Orders     None         Dorthy Cooler, [place] 08/08/23 1936    Rexford Maus, DO 08/08/23 2337

## 2023-08-08 NOTE — Transfer of Care (Signed)
Immediate Anesthesia Transfer of Care Note  Patient: Tricia Richards  Procedure(s) Performed: APPENDECTOMY LAPAROSCOPIC (Abdomen)  Patient Location: PACU  Anesthesia Type:General  Level of Consciousness: drowsy and patient cooperative  Airway & Oxygen Therapy: Patient Spontanous Breathing  Post-op Assessment: Report given to RN, Post -op Vital signs reviewed and stable, and Patient moving all extremities X 4  Post vital signs: Reviewed and stable  Last Vitals:  Vitals Value Taken Time  BP 124/80 08/08/23 2315  Temp    Pulse 108 08/08/23 2315  Resp 22 08/08/23 2315  SpO2 95 % 08/08/23 2315  Vitals shown include unfiled device data.  Last Pain:  Vitals:   08/08/23 2105  TempSrc:   PainSc: 6          Complications: No notable events documented.

## 2023-08-08 NOTE — ED Provider Notes (Signed)
[place] EMERGENCY DEPARTMENT AT [facility] Provider Note   CSN: [id number] Arrival date & time: 08/08/23  1343     History  Chief Complaint  Patient presents with   Abdominal Pain    Tricia Richards is a 57 y.o. female.  57 year old female here as a transfer for appendicitis.  Symptoms began 3 days ago.  Received Zosyn at outpatient facility.  Transferred here for general surgery.   Abdominal Pain      Home Medications Prior to Admission medications   Medication Sig Start Date End Date Taking? Authorizing Provider  Calcium Citrate-Vitamin D (CALCIUM CITRATE+D3 PETITES PO) Take by mouth.    [provider]  eletriptan (RELPAX) 40 MG tablet TAKE 1 TABLET BY MOUTH AT ONSET OF HEADACHE. MAY REPEAT AFTER 2 HOURS *MAX 2TABS/24 HOURS** 01/09/23   Anson Fret, MD  QULIPTA 60 MG TABS TAKE ONE (1) TABLET (60 MG TOTAL) BY MOUTH DAILY. 06/25/23   Anson Fret, MD  sertraline (ZOLOFT) 50 MG tablet Take 75 mg by mouth daily. 09/01/19   [provider]  SUMAtriptan 6 MG/0.5ML SOAJ INJECT 6 MG INTO THE SKIN AS NEEDED. MAY REPEAT IN 2 HOURS. MAXIMUM 2X PER DAY. MAX 2 DAYS A WEEK. 01/09/23   Anson Fret, MD      Allergies    Bactrim [sulfamethoxazole-trimethoprim]    Review of Systems   Review of Systems  Gastrointestinal:  Positive for abdominal pain.    Physical Exam Updated Vital Signs BP 117/81   Pulse 85   Temp 98.4 F (36.9 C) (Oral)   Resp 18   LMP 07/12/2012   SpO2 100%  Physical Exam Vitals reviewed.  Abdominal:     Palpations: Abdomen is soft.     Tenderness: There is abdominal tenderness. Positive signs include McBurney's sign.  Neurological:     Mental Status: She is alert.     ED Results / Procedures / Treatments   Labs (all labs ordered are listed, but only abnormal results are displayed) Labs Reviewed  COMPREHENSIVE METABOLIC PANEL - Abnormal; Notable for the following components:      Result Value   AST 13  (*)    All other components within normal limits  URINALYSIS, ROUTINE W REFLEX MICROSCOPIC - Abnormal; Notable for the following components:   Protein, ur TRACE (*)    All other components within normal limits  LIPASE, BLOOD  CBC    EKG None  Radiology CT ABDOMEN PELVIS W CONTRAST  Result Date: 08/08/2023 CLINICAL DATA:  Right lower quadrant pain EXAM: CT ABDOMEN AND PELVIS WITH CONTRAST TECHNIQUE: Multidetector CT imaging of the abdomen and pelvis was performed using the standard protocol following bolus administration of intravenous contrast. RADIATION DOSE REDUCTION: This exam was performed according to the departmental dose-optimization program which includes automated exposure control, adjustment of the mA and/or kV according to patient size and/or use of iterative reconstruction technique. CONTRAST:  OMNIPAQUE IOHEXOL 300 MG/ML  SOLN COMPARISON:  None Available. FINDINGS: Lower chest: No acute abnormality Hepatobiliary: No focal hepatic abnormality. Gallbladder unremarkable. Pancreas: No focal abnormality or ductal dilatation. Spleen: No focal abnormality.  Normal size. Adrenals/Urinary Tract: Small scattered subcentimeter hypodensities throughout the kidneys most compatible with cysts. No follow-up imaging recommended. No stones or hydronephrosis. Adrenal glands and urinary bladder unremarkable. Stomach/Bowel: Retrocecal appendix which is dilated measuring up to 13 mm. Multiple appendicoliths throughout the lumen. Surrounding inflammation. Findings compatible with acute appendicitis. Stomach, large and small bowel grossly unremarkable. Vascular/Lymphatic:  No evidence of aneurysm or adenopathy. Scattered aortic calcifications. Reproductive: Uterus and adnexa unremarkable.  No mass. Other: Small amount of free fluid in the pelvis.  No free air. Musculoskeletal: No acute bony abnormality. IMPRESSION: Dilated, inflamed retrocecal appendix with multiple appendicoliths. Findings compatible  with acute appendicitis. Small amount of free fluid in the pelvis. These results were called by telephone at the time of interpretation on 08/08/2023 at 7:10 pm to provider Dr. Theresia Lo, who verbally acknowledged these results. Electronically Signed   By: Charlett Nose M.D.   On: 08/08/2023 19:12    Procedures Procedures    Medications Ordered in ED Medications  piperacillin-tazobactam (ZOSYN) IVPB 3.375 g (3.375 g Intravenous New Bag/Given 08/08/23 1954)    Followed by  piperacillin-tazobactam (ZOSYN) IVPB 3.375 g (has no administration in time range)  oxyCODONE-acetaminophen (PERCOCET/ROXICET) 5-325 MG per tablet 1 tablet (0 tablets Oral Hold 08/08/23 1956)  morphine (PF) 4 MG/ML injection 4 mg (has no administration in time range)  ondansetron (ZOFRAN) injection 4 mg (has no administration in time range)  iohexol (OMNIPAQUE) 300 MG/ML solution 100 mL (100 mLs Intravenous Contrast Given 08/08/23 1723)    ED Course/ Medical Decision Making/ A&P                                 Medical Decision Making 57 year old female here today with appendicitis.  Plan-will provide some analgesia for the patient.  I placed a surgical consult.  Anticipate surgical admission.  Amount and/or Complexity of Data Reviewed Labs: ordered. Radiology: ordered.  Risk Prescription drug management.          Final Clinical Impression(s) / ED Diagnoses Final diagnoses:  Acute appendicitis, unspecified acute appendicitis type    Rx / DC Orders ED Discharge Orders     None         Arletha Pili, DO 08/08/23 2059

## 2023-08-08 NOTE — Progress Notes (Signed)
Pharmacy Antibiotic Note  Tricia Richards is a 57 y.o. female for which pharmacy has been consulted for zosyn dosing for  IAI .  SCr 0.94  Plan: Zosyn 3.375g IV q8h (4 hour infusion) Monitor WBC, fever, renal function, cultures De-escalate when able     Temp (24hrs), Avg:98.2 F (36.8 C), Min:98 F (36.7 C), Max:98.4 F (36.9 C)  Recent Labs  Lab 08/08/23 1400  WBC 7.0  CREATININE 0.94    CrCl cannot be calculated (Unknown ideal weight.).    Allergies  Allergen Reactions   Bactrim [Sulfamethoxazole-Trimethoprim] Rash   Microbiology results: Pending  Thank you for allowing pharmacy to be a part of this patient's care.  Delmar Landau, PharmD, BCPS 08/08/2023 7:26 PM ED Clinical Pharmacist -  [phone]

## 2023-08-08 NOTE — ED Triage Notes (Signed)
RLQ pain for 3 days.  No n/v/d. LBM was 3 days ago (this is her usual)

## 2023-08-08 NOTE — ED Notes (Signed)
From [facility] to see dr white fpr appendicictis  abd pain for 3 days no temp  op permit signed

## 2023-08-08 NOTE — ED Notes (Signed)
To short stay

## 2023-08-08 NOTE — Anesthesia Procedure Notes (Signed)
Procedure Name: Intubation Date/Time: 08/08/2023 9:51 PM  Performed by: Alease Medina, CRNAPre-anesthesia Checklist: Patient identified, Emergency Drugs available, Suction available and Patient being monitored Patient Re-evaluated:Patient Re-evaluated prior to induction Oxygen Delivery Method: Circle system utilized Preoxygenation: Pre-oxygenation with 100% oxygen Induction Type: IV induction Ventilation: Mask ventilation without difficulty Laryngoscope Size: Mac and 3 Grade View: Grade I Tube type: Oral Tube size: 7.0 mm Number of attempts: 1 Airway Equipment and Method: Stylet Placement Confirmation: ETT inserted through vocal cords under direct vision, positive ETCO2 and breath sounds checked- equal and bilateral Secured at: 21 cm Tube secured with: Tape Dental Injury: Teeth and Oropharynx as per pre-operative assessment

## 2023-08-08 NOTE — Op Note (Signed)
EASTON GALES [id number]   PRE-OPERATIVE DIAGNOSIS: Acute appendicitis  POST-OPERATIVE DIAGNOSIS: Acute appendicitis with contained perforation  PROCEDURE: Laparoscopic appendectomy  SURGEON:  Marin Olp, MD FACS  ASSISTANT: OR staff  ANESTHESIA: General endotracheal  EBL:   10 mL  DRAINS: None  SPECIMEN:  Appendix  COUNTS:  Sponge, needle and instrument counts were reported correct x2 at conclusion of the operation  DISPOSITION:  PACU in satisfactory condition  COMPLICATIONS: none  FINDINGS: Acute on chronic type inflammation involving a retrocecal appendix.  With gentle manipulation, multiple punctate areas of perforation are apparent with small amount of spillage.  Appendectomy otherwise carried out uneventfully.  DESCRIPTION:   The patient was identified & brought into the operating room. SCDs were in place and functioning. General endotracheal anesthesia was administered. Preoperative antibiotics were administered. The patient was positioned supine with left arm tucked. Hair on the abdomen was then clipped by the OR team. The abdomen was prepped and draped in the standard sterile fashion. A surgical timeout confirmed our plan.  A small incision was made in the infraumbilical fold. The subcutaneous tissue was dissected and the umbilical stalk identified. The stalk was grasped with a Kocher and retracted outwardly. The infraumbilical fascia was exposed and incised. Peritoneal entry was carefully made bluntly. A 0 Vicryl purse-string suture was placed and then the [facility] port was introduced into the abdomen.  CO2 insufflation commenced to . The laparoscope was inserted and confirmed no evidence of trocar site complications. The patient was then positioned in Trendelenburg. Two additional ports were placed - one in left lower quadrant and another in the suprapubic midline taking care to stay well above the bladder - 3 fingerbreadths above the pubic symphysis. The  bed was then slightly tilted to place the left side down.  The appendix is identified in the right lower quadrant at its base.  The body and proximal portions of the appendix however are retrocecal in nature.  Much of her ascending colon is not overtly fixed and therefore the entire course of the appendix is actually visible without mobilizing the colon.  The appendix is carefully freed from surrounding structures bluntly without difficulty.  There are multiple small foci of perforation on the body of the appendix with a small amount of liquid stool spillage.  This was ultimately controlled with the suction irrigator device.  2 fecaliths also exuded from the appendix they were removed with the suction irrigator device.  Care was taken to avoid injuring any retroperitoneal structures. The appendix was elevated.  The base of the appendix was circumferentially dissected taking care to preserve the cecum free of injury. The base was noted to be viable and healthy appearing. The terminal ileum, cecum and ascending colon also appeared normal. The base of the appendix was then stapled with a blue load, taking it flush with the cecum, but also steering clear of the ileocecal valve. The mesoappendix is then divided using the harmonic scalpel, maintaining a plane close to the appendix and terminating at our previously created window. The cut edge of the mesoappendix is observed and hemostatic. The appendix was placed in an EndoBag and removed from the umbilical port site and passed off as specimen.  The right lower quadrant was conservatively irrigated. Hemostasis was noted to be achieved - taking time to inspect the ligated mesoappendix. Staple line was noted to be intact on the cecum with well formed staples and no bleeding. The right lower quadrant appeared clean and as such, no drain was placed.  The left lower quadrant and suprapubic ports were removed under direct visualization. The CO2 was exhausted from the  abdomen. The umbilical fascia was then closed by tieing the 0 Vicryl suture, obliterating the fascial defect. The fascia was then palpated and noted to be completely closed. The skin of all port sites was approximated using 4-0 Monocryl suture. The abdomen is then washed and dried. The incisions are covered with Dermabond.  She was then awakened from anesthesia, extubated, and transferred to a stretcher for transport to PACU in satisfactory condition.

## 2023-08-08 NOTE — ED Notes (Signed)
Report given to Teodora Medici, RN at [facility] ED via phone

## 2023-08-08 NOTE — H&P (Addendum)
CC: RLQ pain  HPI: Tricia Richards is an 57 y.o. female with hx of kidney stones, migraines, presented to [facility] with 3d hx of sharp persistent RLQ pain. Never had this kind of pain before. Does not radiate. No aggravating/alleviating factors. No fever/chills, nausea/vomiting, diarrhea or constipation. Denies any blood in her stool.   Reports her last colonoscopy was about 3 years ago - follows with [facility] so we do not have copy of it here.   Past Medical History:  Diagnosis Date   Brain tumor [facility])    acoustic neuroma, benign. Left   Cataracts, bilateral    Deafness    left   Kidney stones    Migraine    Migraine headache without aura    Migraine with aura     Past Surgical History:  Procedure Laterality Date   ACOUSTIC NEUROMA RESECTION  11/2001   tummy tuck      Family History  Problem Relation Age of Onset   Depression Mother    Diabetes Father    Hypertension Father    Heart disease Father    Migraines Neg Hx     Social:  reports that she quit smoking about 29 years ago. Her smoking use included cigarettes. She has never used smokeless tobacco. She reports current alcohol use. She reports that she does not use drugs.  Allergies:  Allergies  Allergen Reactions   Bactrim [Sulfamethoxazole-Trimethoprim] Rash    Medications: I have reviewed the patient's current medications.  Results for orders placed or performed during the hospital encounter of 08/08/23 (from the past 48 hour(s))  Lipase, blood     Status: None   Collection Time: 08/08/23  2:00 PM  Result Value Ref Range   Lipase 20 11 - 51 U/L    Comment: Performed at [facility], [address], [place], [place] [address]  Comprehensive metabolic panel     Status: Abnormal   Collection Time: 08/08/23  2:00 PM  Result Value Ref Range   Sodium 138 135 - 145 mmol/L   Potassium 4.2 3.5 - 5.1 mmol/L   Chloride 102 98 - 111 mmol/L   CO2 29 22 - 32 mmol/L   Glucose, Bld 88 70 - 99  mg/dL    Comment: Glucose reference range applies only to samples taken after fasting for at least 8 hours.   BUN 14 6 - 20 mg/dL   Creatinine, Ser 1.30 0.44 - 1.00 mg/dL   Calcium 86.5 8.9 - 78.4 mg/dL   Total Protein 7.6 6.5 - 8.1 g/dL   Albumin 4.6 3.5 - 5.0 g/dL   AST 13 (L) 15 - 41 U/L   ALT 6 0 - 44 U/L   Alkaline Phosphatase 58 38 - 126 U/L   Total Bilirubin 0.6 0.3 - 1.2 mg/dL   GFR, Estimated >69 >62 mL/min    Comment: (NOTE) Calculated using the CKD-EPI Creatinine Equation (2021)    Anion gap 7 5 - 15    Comment: Performed at [facility], [address], [place], [place] [address]  CBC     Status: None   Collection Time: 08/08/23  2:00 PM  Result Value Ref Range   WBC 7.0 4.0 - 10.5 K/uL   RBC 5.01 3.87 - 5.11 MIL/uL   Hemoglobin 14.5 12.0 - 15.0 g/dL   HCT 13.2 44.0 - 10.2 %   MCV 88.6 80.0 - 100.0 fL   MCH 28.9 26.0 - 34.0 pg   MCHC 32.7  30.0 - 36.0 g/dL   RDW 10.2 72.5 - 36.6 %   Platelets 231 150 - 400 K/uL   nRBC 0.0 0.0 - 0.2 %    Comment: Performed at [facility], [address] [place], [place] [address]  Urinalysis, Routine w reflex microscopic -Urine, Clean Catch     Status: Abnormal   Collection Time: 08/08/23  2:00 PM  Result Value Ref Range   Color, Urine YELLOW YELLOW   APPearance CLEAR CLEAR   Specific Gravity, Urine 1.029 1.005 - 1.030   pH 6.0 5.0 - 8.0   Glucose, UA NEGATIVE NEGATIVE mg/dL   Hgb urine dipstick NEGATIVE NEGATIVE   Bilirubin Urine NEGATIVE NEGATIVE   Ketones, ur NEGATIVE NEGATIVE mg/dL   Protein, ur TRACE (A) NEGATIVE mg/dL   Nitrite NEGATIVE NEGATIVE   Leukocytes,Ua NEGATIVE NEGATIVE    Comment: Performed at [facility], [address], [place], [place] [address]    CT ABDOMEN PELVIS W CONTRAST  Result Date: 08/08/2023 CLINICAL DATA:  Right lower quadrant pain EXAM: CT ABDOMEN AND PELVIS WITH CONTRAST TECHNIQUE: Multidetector CT imaging of the abdomen and  pelvis was performed using the standard protocol following bolus administration of intravenous contrast. RADIATION DOSE REDUCTION: This exam was performed according to the departmental dose-optimization program which includes automated exposure control, adjustment of the mA and/or kV according to patient size and/or use of iterative reconstruction technique. CONTRAST:  OMNIPAQUE IOHEXOL 300 MG/ML  SOLN COMPARISON:  None Available. FINDINGS: Lower chest: No acute abnormality Hepatobiliary: No focal hepatic abnormality. Gallbladder unremarkable. Pancreas: No focal abnormality or ductal dilatation. Spleen: No focal abnormality.  Normal size. Adrenals/Urinary Tract: Small scattered subcentimeter hypodensities throughout the kidneys most compatible with cysts. No follow-up imaging recommended. No stones or hydronephrosis. Adrenal glands and urinary bladder unremarkable. Stomach/Bowel: Retrocecal appendix which is dilated measuring up to 13 mm. Multiple appendicoliths throughout the lumen. Surrounding inflammation. Findings compatible with acute appendicitis. Stomach, large and small bowel grossly unremarkable. Vascular/Lymphatic: No evidence of aneurysm or adenopathy. Scattered aortic calcifications. Reproductive: Uterus and adnexa unremarkable.  No mass. Other: Small amount of free fluid in the pelvis.  No free air. Musculoskeletal: No acute bony abnormality. IMPRESSION: Dilated, inflamed retrocecal appendix with multiple appendicoliths. Findings compatible with acute appendicitis. Small amount of free fluid in the pelvis. These results were called by telephone at the time of interpretation on 08/08/2023 at 7:10 pm to provider Dr. Theresia Lo, who verbally acknowledged these results. Electronically Signed   By: Charlett Nose M.D.   On: 08/08/2023 19:12    ROS - all of the below systems have been reviewed with the patient and positives are indicated with bold text General: chills, fever or night sweats Eyes: blurry  vision or double vision ENT: epistaxis or sore throat Allergy/Immunology: itchy/watery eyes or nasal congestion Hematologic/Lymphatic: bleeding problems, blood clots or swollen lymph nodes Endocrine: temperature intolerance or unexpected weight changes Breast: new or changing breast lumps or nipple discharge Resp: cough, shortness of breath, or wheezing CV: chest pain or dyspnea on exertion GI: as per HPI GU: dysuria, trouble voiding, or hematuria MSK: joint pain or joint stiffness Neuro: TIA or stroke symptoms Derm: pruritus and skin lesion changes Psych: anxiety and depression  PE Blood pressure 117/81, pulse 85, temperature 98.4 F (36.9 C), temperature source Oral, resp. rate 18, last menstrual period 07/12/2012, SpO2 100%. Constitutional: NAD; conversant; no deformities Eyes: Moist conjunctiva; no lid lag; anicteric; PERRL Neck: Trachea midline; no thyromegaly Lungs: Normal respiratory effort; no tactile  fremitus CV: RRR; no palpable thrills; no pitting edema GI: Abd soft, focally ttp in RLQ; no palpable hepatosplenomegaly MSK: Normal range of motion of extremities; no clubbing/cyanosis Psychiatric: Appropriate affect; alert and oriented x3 Lymphatic: No palpable cervical or axillary lymphadenopathy  Results for orders placed or performed during the hospital encounter of 08/08/23 (from the past 48 hour(s))  Lipase, blood     Status: None   Collection Time: 08/08/23  2:00 PM  Result Value Ref Range   Lipase 20 11 - 51 U/L    Comment: Performed at [facility], [address], [place], [place] [address]  Comprehensive metabolic panel     Status: Abnormal   Collection Time: 08/08/23  2:00 PM  Result Value Ref Range   Sodium 138 135 - 145 mmol/L   Potassium 4.2 3.5 - 5.1 mmol/L   Chloride 102 98 - 111 mmol/L   CO2 29 22 - 32 mmol/L   Glucose, Bld 88 70 - 99 mg/dL    Comment: Glucose reference range applies only to samples taken after fasting for at least  8 hours.   BUN 14 6 - 20 mg/dL   Creatinine, Ser 0.27 0.44 - 1.00 mg/dL   Calcium 25.3 8.9 - 66.4 mg/dL   Total Protein 7.6 6.5 - 8.1 g/dL   Albumin 4.6 3.5 - 5.0 g/dL   AST 13 (L) 15 - 41 U/L   ALT 6 0 - 44 U/L   Alkaline Phosphatase 58 38 - 126 U/L   Total Bilirubin 0.6 0.3 - 1.2 mg/dL   GFR, Estimated >40 >34 mL/min    Comment: (NOTE) Calculated using the CKD-EPI Creatinine Equation (2021)    Anion gap 7 5 - 15    Comment: Performed at [facility], [address], [place], [place] [address]  CBC     Status: None   Collection Time: 08/08/23  2:00 PM  Result Value Ref Range   WBC 7.0 4.0 - 10.5 K/uL   RBC 5.01 3.87 - 5.11 MIL/uL   Hemoglobin 14.5 12.0 - 15.0 g/dL   HCT 56.3 87.5 - 64.3 %   MCV 88.6 80.0 - 100.0 fL   MCH 28.9 26.0 - 34.0 pg   MCHC 32.7 30.0 - 36.0 g/dL   RDW 32.9 51.8 - 84.1 %   Platelets 231 150 - 400 K/uL   nRBC 0.0 0.0 - 0.2 %    Comment: Performed at [facility], [address], [place], [place] [address]  Urinalysis, Routine w reflex microscopic -Urine, Clean Catch     Status: Abnormal   Collection Time: 08/08/23  2:00 PM  Result Value Ref Range   Color, Urine YELLOW YELLOW   APPearance CLEAR CLEAR   Specific Gravity, Urine 1.029 1.005 - 1.030   pH 6.0 5.0 - 8.0   Glucose, UA NEGATIVE NEGATIVE mg/dL   Hgb urine dipstick NEGATIVE NEGATIVE   Bilirubin Urine NEGATIVE NEGATIVE   Ketones, ur NEGATIVE NEGATIVE mg/dL   Protein, ur TRACE (A) NEGATIVE mg/dL   Nitrite NEGATIVE NEGATIVE   Leukocytes,Ua NEGATIVE NEGATIVE    Comment: Performed at [facility], [address], [place], [place] [address]    CT ABDOMEN PELVIS W CONTRAST  Result Date: 08/08/2023 CLINICAL DATA:  Right lower quadrant pain EXAM: CT ABDOMEN AND PELVIS WITH CONTRAST TECHNIQUE: Multidetector CT imaging of the abdomen and pelvis was performed using the standard protocol following bolus administration of intravenous contrast.  RADIATION DOSE REDUCTION: This exam was performed according  to the departmental dose-optimization program which includes automated exposure control, adjustment of the mA and/or kV according to patient size and/or use of iterative reconstruction technique. CONTRAST:  OMNIPAQUE IOHEXOL 300 MG/ML  SOLN COMPARISON:  None Available. FINDINGS: Lower chest: No acute abnormality Hepatobiliary: No focal hepatic abnormality. Gallbladder unremarkable. Pancreas: No focal abnormality or ductal dilatation. Spleen: No focal abnormality.  Normal size. Adrenals/Urinary Tract: Small scattered subcentimeter hypodensities throughout the kidneys most compatible with cysts. No follow-up imaging recommended. No stones or hydronephrosis. Adrenal glands and urinary bladder unremarkable. Stomach/Bowel: Retrocecal appendix which is dilated measuring up to 13 mm. Multiple appendicoliths throughout the lumen. Surrounding inflammation. Findings compatible with acute appendicitis. Stomach, large and small bowel grossly unremarkable. Vascular/Lymphatic: No evidence of aneurysm or adenopathy. Scattered aortic calcifications. Reproductive: Uterus and adnexa unremarkable.  No mass. Other: Small amount of free fluid in the pelvis.  No free air. Musculoskeletal: No acute bony abnormality. IMPRESSION: Dilated, inflamed retrocecal appendix with multiple appendicoliths. Findings compatible with acute appendicitis. Small amount of free fluid in the pelvis. These results were called by telephone at the time of interpretation on 08/08/2023 at 7:10 pm to provider Dr. Theresia Lo, who verbally acknowledged these results. Electronically Signed   By: Charlett Nose M.D.   On: 08/08/2023 19:12      A/P: Tricia Richards is an 57 y.o. female with hx migraines, kidney stones here with acute appendicitis with appendicoliths  -The anatomy and physiology of the GI tract was discussed with the patient. The pathophysiology of appendicitis was discussed as  well. -We reviewed options moving forward for treatment, covering IV abx vs surgery. We discussed that with antibiotics alone, there is reasonable success in managing appendicitis, however, in her particular case, appendicolith being a relative contraindication and higher risk for antibiotic failure. We discussed appendectomy - laparoscopic and potential open techniques as well as scenarios where an ileocecectomy could be necessary. We discussed the material risks (including, but not limited to, pain, bleeding, infection, scarring, need for blood transfusion, damage to surrounding structures- blood vessels/nerves/viscus/organs, damage to ureter/bladder, leak from staple line, need for additional procedures, hernia, recurrence although quite low, pneumonia, heart attack, stroke, death) benefits and alternatives to surgery were discussed. The patient's questions were answered to her satisfaction, she voiced understanding and elected to proceed with surgery. Additionally, we discussed typical postoperative expectations and the recovery process.  Marin Olp, MD [facility], [facility]

## 2023-08-09 ENCOUNTER — Encounter (HOSPITAL_COMMUNITY): Payer: Self-pay | Admitting: Surgery

## 2023-08-09 LAB — CBC
HCT: 42.2 % (ref 36.0–46.0)
Hemoglobin: 14 g/dL (ref 12.0–15.0)
MCH: 29.2 pg (ref 26.0–34.0)
MCHC: 33.2 g/dL (ref 30.0–36.0)
MCV: 88.1 fL (ref 80.0–100.0)
Platelets: 202 10*3/uL (ref 150–400)
RBC: 4.79 MIL/uL (ref 3.87–5.11)
RDW: 12.6 % (ref 11.5–15.5)
WBC: 7.4 10*3/uL (ref 4.0–10.5)
nRBC: 0 % (ref 0.0–0.2)

## 2023-08-09 LAB — HIV ANTIBODY (ROUTINE TESTING W REFLEX): HIV Screen 4th Generation wRfx: NONREACTIVE

## 2023-08-09 MED ORDER — ONDANSETRON HCL 4 MG/2ML IJ SOLN
4.0000 mg | Freq: Four times a day (QID) | INTRAMUSCULAR | Status: DC | PRN
  Administered 2023-08-09: 4 mg via INTRAVENOUS
  Filled 2023-08-09: qty 2

## 2023-08-09 MED ORDER — ELETRIPTAN HYDROBROMIDE 20 MG PO TABS: 40.0000 mg | ORAL_TABLET | ORAL | Status: DC | PRN

## 2023-08-09 MED ORDER — DIPHENHYDRAMINE HCL 50 MG/ML IJ SOLN: 12.5000 mg | Freq: Four times a day (QID) | INTRAMUSCULAR | Status: DC | PRN

## 2023-08-09 MED ORDER — ACETAMINOPHEN 500 MG PO TABS
1000.0000 mg | ORAL_TABLET | Freq: Four times a day (QID) | ORAL | Status: DC
  Administered 2023-08-09 – 2023-08-10 (×5): 1000 mg via ORAL
  Filled 2023-08-09 (×5): qty 2

## 2023-08-09 MED ORDER — HYDROMORPHONE HCL 1 MG/ML IJ SOLN
0.5000 mg | INTRAMUSCULAR | Status: DC | PRN
  Administered 2023-08-09 – 2023-08-10 (×6): 0.5 mg via INTRAVENOUS
  Filled 2023-08-09 (×6): qty 0.5

## 2023-08-09 MED ORDER — ENOXAPARIN SODIUM 40 MG/0.4ML IJ SOSY
40.0000 mg | PREFILLED_SYRINGE | INTRAMUSCULAR | Status: DC
  Administered 2023-08-09: 40 mg via SUBCUTANEOUS
  Filled 2023-08-09: qty 0.4

## 2023-08-09 MED ORDER — HYDRALAZINE HCL 20 MG/ML IJ SOLN: 10.0000 mg | INTRAMUSCULAR | Status: DC | PRN

## 2023-08-09 MED ORDER — SERTRALINE HCL 50 MG PO TABS
75.0000 mg | ORAL_TABLET | Freq: Every day | ORAL | Status: DC
  Administered 2023-08-09: 75 mg via ORAL
  Filled 2023-08-09 (×3): qty 2

## 2023-08-09 MED ORDER — DEXTROSE-SODIUM CHLORIDE 5-0.45 % IV SOLN: INTRAVENOUS | Status: DC

## 2023-08-09 MED ORDER — IBUPROFEN 600 MG PO TABS
600.0000 mg | ORAL_TABLET | Freq: Four times a day (QID) | ORAL | Status: DC | PRN
  Administered 2023-08-10: 600 mg via ORAL
  Filled 2023-08-09: qty 1

## 2023-08-09 MED ORDER — SUMATRIPTAN SUCCINATE 6 MG/0.5ML ~~LOC~~ SOLN: 6.0000 mg | Freq: Two times a day (BID) | SUBCUTANEOUS | Status: DC | PRN

## 2023-08-09 MED ORDER — MELATONIN 3 MG PO TABS
3.0000 mg | ORAL_TABLET | Freq: Every evening | ORAL | Status: DC | PRN
  Administered 2023-08-09: 3 mg via ORAL
  Filled 2023-08-09: qty 1

## 2023-08-09 MED ORDER — SIMETHICONE 80 MG PO CHEW: 40.0000 mg | CHEWABLE_TABLET | Freq: Four times a day (QID) | ORAL | Status: DC | PRN

## 2023-08-09 MED ORDER — TRAMADOL HCL 50 MG PO TABS
50.0000 mg | ORAL_TABLET | Freq: Four times a day (QID) | ORAL | Status: DC | PRN
  Administered 2023-08-09 (×2): 50 mg via ORAL
  Filled 2023-08-09 (×3): qty 1

## 2023-08-09 MED ORDER — ONDANSETRON 4 MG PO TBDP: 4.0000 mg | ORAL_TABLET | Freq: Four times a day (QID) | ORAL | Status: DC | PRN

## 2023-08-09 MED ORDER — PIPERACILLIN-TAZOBACTAM 3.375 G IVPB
3.3750 g | Freq: Three times a day (TID) | INTRAVENOUS | Status: DC
  Administered 2023-08-09 – 2023-08-10 (×3): 3.375 g via INTRAVENOUS
  Filled 2023-08-09 (×3): qty 50

## 2023-08-09 MED ORDER — DIPHENHYDRAMINE HCL 12.5 MG/5ML PO ELIX: 12.5000 mg | ORAL_SOLUTION | Freq: Four times a day (QID) | ORAL | Status: DC | PRN

## 2023-08-09 NOTE — Plan of Care (Signed)
  Problem: Education: Goal: Knowledge of General Education information will improve Description: Including pain rating scale, medication(s)/side effects and non-pharmacologic comfort measures Outcome: Progressing   Problem: Activity: Goal: Risk for activity intolerance will decrease Outcome: Progressing   

## 2023-08-09 NOTE — Plan of Care (Signed)
  Problem: Nutrition: Goal: Adequate nutrition will be maintained Outcome: Progressing   Problem: Pain Management: Goal: General experience of comfort will improve Outcome: Progressing   Problem: Safety: Goal: Ability to remain free from injury will improve Outcome: Progressing

## 2023-08-09 NOTE — Plan of Care (Signed)
  Problem: Nutrition: Goal: Adequate nutrition will be maintained 08/09/2023 1936 by Bernette Mayers, RN Outcome: Progressing 08/09/2023 1936 by Bernette Mayers, RN Outcome: Progressing   Problem: Pain Management: Goal: General experience of comfort will improve 08/09/2023 1936 by Bernette Mayers, RN Outcome: Progressing 08/09/2023 1936 by Bernette Mayers, RN Outcome: Progressing   Problem: Safety: Goal: Ability to remain free from injury will improve 08/09/2023 1936 by Bernette Mayers, RN Outcome: Progressing 08/09/2023 1936 by Bernette Mayers, RN Outcome: Progressing

## 2023-08-09 NOTE — Progress Notes (Signed)
Assessment & Plan: POD#1 - status post lap appendectomy for perforated appendicitis - CW, 08/08/2023 - IV Zosyn per pharmacy - CLD - encouraged OOB, ambulation - 5 days oral abx's on discharge        Tricia Level, MD [facility] practice Office: [phone]        Chief Complaint: Acute appendicitis  Subjective: Patient in bed, mild pain.  Objective: Vital signs in last 24 hours: Temp:  [97.8 F (36.6 C)-98.5 F (36.9 C)] 98.2 F (36.8 C) (10/26 0805) Pulse Rate:  [64-108] 92 (10/26 0805) Resp:  [15-23] 18 (10/26 0805) BP: (97-124)/(64-88) 97/64 (10/26 0805) SpO2:  [92 %-100 %] 96 % (10/26 0805) Last BM Date : 08/08/23  Intake/Output from previous day: 10/25 0701 - 10/26 0700 In: 500 [I.V.:500] Out: 10 [Blood:10] Intake/Output this shift: No intake/output data recorded.  Physical Exam: HEENT - sclerae clear, mucous membranes moist Abdomen - soft, scaphoid; mild RLQ tenderness Ext - no edema, non-tender  Lab Results:  Recent Labs    08/08/23 1400 08/09/23 0425  WBC 7.0 7.4  HGB 14.5 14.0  HCT 44.4 42.2  PLT 231 202   BMET Recent Labs    08/08/23 1400  NA 138  K 4.2  CL 102  CO2 29  GLUCOSE 88  BUN 14  CREATININE 0.94  CALCIUM 10.3   PT/INR No results for input(s): "LABPROT", "INR" in the last 72 hours. Comprehensive Metabolic Panel:    Component Value Date/Time   NA 138 08/08/2023 1400   NA 141 11/23/2019 1031   K 4.2 08/08/2023 1400   K 4.1 11/23/2019 1031   CL 102 08/08/2023 1400   CL 107 (H) 11/23/2019 1031   CO2 29 08/08/2023 1400   CO2 22 11/23/2019 1031   BUN 14 08/08/2023 1400   BUN 16 11/23/2019 1031   CREATININE 0.94 08/08/2023 1400   CREATININE 1.11 (H) 11/23/2019 1031   GLUCOSE 88 08/08/2023 1400   GLUCOSE 80 11/23/2019 1031   CALCIUM 10.3 08/08/2023 1400   CALCIUM 9.5 11/23/2019 1031   AST 13 (L) 08/08/2023 1400   AST 16 11/23/2019 1031   ALT 6 08/08/2023 1400   ALT 9 11/23/2019 1031    ALKPHOS 58 08/08/2023 1400   ALKPHOS 50 11/23/2019 1031   BILITOT 0.6 08/08/2023 1400   BILITOT 0.2 11/23/2019 1031   PROT 7.6 08/08/2023 1400   PROT 6.7 11/23/2019 1031   ALBUMIN 4.6 08/08/2023 1400   ALBUMIN 4.0 11/23/2019 1031    Studies/Results: CT ABDOMEN PELVIS W CONTRAST  Result Date: 08/08/2023 CLINICAL DATA:  Right lower quadrant pain EXAM: CT ABDOMEN AND PELVIS WITH CONTRAST TECHNIQUE: Multidetector CT imaging of the abdomen and pelvis was performed using the standard protocol following bolus administration of intravenous contrast. RADIATION DOSE REDUCTION: This exam was performed according to the departmental dose-optimization program which includes automated exposure control, adjustment of the mA and/or kV according to patient size and/or use of iterative reconstruction technique. CONTRAST:  OMNIPAQUE IOHEXOL 300 MG/ML  SOLN COMPARISON:  None Available. FINDINGS: Lower chest: No acute abnormality Hepatobiliary: No focal hepatic abnormality. Gallbladder unremarkable. Pancreas: No focal abnormality or ductal dilatation. Spleen: No focal abnormality.  Normal size. Adrenals/Urinary Tract: Small scattered subcentimeter hypodensities throughout the kidneys most compatible with cysts. No follow-up imaging recommended. No stones or hydronephrosis. Adrenal glands and urinary bladder unremarkable. Stomach/Bowel: Retrocecal appendix which is dilated measuring up to 13 mm. Multiple appendicoliths throughout the lumen. Surrounding inflammation. Findings compatible with acute  appendicitis. Stomach, large and small bowel grossly unremarkable. Vascular/Lymphatic: No evidence of aneurysm or adenopathy. Scattered aortic calcifications. Reproductive: Uterus and adnexa unremarkable.  No mass. Other: Small amount of free fluid in the pelvis.  No free air. Musculoskeletal: No acute bony abnormality. IMPRESSION: Dilated, inflamed retrocecal appendix with multiple appendicoliths. Findings compatible with  acute appendicitis. Small amount of free fluid in the pelvis. These results were called by telephone at the time of interpretation on 08/08/2023 at 7:10 pm to provider Dr. Theresia Lo, who verbally acknowledged these results. Electronically Signed   By: Charlett Nose M.D.   On: 08/08/2023 19:12      Tricia Richards 08/09/2023  Patient ID: Tricia Richards, female   DOB: 05-17-1966, 57 y.o.   MRN: [id number]

## 2023-08-10 MED ORDER — TRAMADOL HCL 50 MG PO TABS: 50.0000 mg | ORAL_TABLET | Freq: Four times a day (QID) | ORAL | 0 refills | Status: DC | PRN

## 2023-08-10 MED ORDER — AMOXICILLIN-POT CLAVULANATE 875-125 MG PO TABS: 1.0000 | ORAL_TABLET | Freq: Two times a day (BID) | ORAL | 0 refills | Status: DC

## 2023-08-10 NOTE — Progress Notes (Signed)
Assessment & Plan: POD#2 - status post lap appendectomy for perforated appendicitis - CW, 08/08/2023 - IV Zosyn per pharmacy - advance to regular diet - if diet tolerated, plan discharge home this afternoon on Augmentin for 7 days  Will arrange follow up at [facility] office, 2 weeks.        [name], MD [facility] practice Office: [phone]        Chief Complaint: Perforated appendicitis  Subjective: Patient in bed, family at bedside.  Passing flatus, BM this AM.  Tolerating clear liquids.  Objective: Vital signs in last 24 hours: Temp:  [97.8 F (36.6 C)-98.4 F (36.9 C)] 98 F (36.7 C) (10/27 0842) Pulse Rate:  [73-82] 74 (10/27 0842) Resp:  [16-18] 16 (10/27 0842) BP: (99-116)/(61-76) 99/67 (10/27 0842) SpO2:  [97 %-100 %] 97 % (10/27 0618) Last BM Date : 08/08/23  Intake/Output from previous day: 10/26 0701 - 10/27 0700 In: 975.8 [I.V.:925.8; IV Piggyback:50] Out: -  Intake/Output this shift: No intake/output data recorded.  Physical Exam: Abdomen - soft, scaphoid; mild tenderness RLQ, no mass, no guarding   Lab Results:  Recent Labs    08/08/23 1400 08/09/23 0425  WBC 7.0 7.4  HGB 14.5 14.0  HCT 44.4 42.2  PLT 231 202   BMET Recent Labs    08/08/23 1400  NA 138  K 4.2  CL 102  CO2 29  GLUCOSE 88  BUN 14  CREATININE 0.94  CALCIUM 10.3   PT/INR No results for input(s): "LABPROT", "INR" in the last 72 hours. Comprehensive Metabolic Panel:    Component Value Date/Time   NA 138 08/08/2023 1400   NA 141 11/23/2019 1031   K 4.2 08/08/2023 1400   K 4.1 11/23/2019 1031   CL 102 08/08/2023 1400   CL 107 (H) 11/23/2019 1031   CO2 29 08/08/2023 1400   CO2 22 11/23/2019 1031   BUN 14 08/08/2023 1400   BUN 16 11/23/2019 1031   CREATININE 0.94 08/08/2023 1400   CREATININE 1.11 (H) 11/23/2019 1031   GLUCOSE 88 08/08/2023 1400   GLUCOSE 80 11/23/2019 1031   CALCIUM 10.3 08/08/2023 1400   CALCIUM 9.5 11/23/2019  1031   AST 13 (L) 08/08/2023 1400   AST 16 11/23/2019 1031   ALT 6 08/08/2023 1400   ALT 9 11/23/2019 1031   ALKPHOS 58 08/08/2023 1400   ALKPHOS 50 11/23/2019 1031   BILITOT 0.6 08/08/2023 1400   BILITOT 0.2 11/23/2019 1031   PROT 7.6 08/08/2023 1400   PROT 6.7 11/23/2019 1031   ALBUMIN 4.6 08/08/2023 1400   ALBUMIN 4.0 11/23/2019 1031    Studies/Results: CT ABDOMEN PELVIS W CONTRAST  Result Date: 08/08/2023 CLINICAL DATA:  Right lower quadrant pain EXAM: CT ABDOMEN AND PELVIS WITH CONTRAST TECHNIQUE: Multidetector CT imaging of the abdomen and pelvis was performed using the standard protocol following bolus administration of intravenous contrast. RADIATION DOSE REDUCTION: This exam was performed according to the departmental dose-optimization program which includes automated exposure control, adjustment of the mA and/or kV according to patient size and/or use of iterative reconstruction technique. CONTRAST:  OMNIPAQUE IOHEXOL 300 MG/ML  SOLN COMPARISON:  None Available. FINDINGS: Lower chest: No acute abnormality Hepatobiliary: No focal hepatic abnormality. Gallbladder unremarkable. Pancreas: No focal abnormality or ductal dilatation. Spleen: No focal abnormality.  Normal size. Adrenals/Urinary Tract: Small scattered subcentimeter hypodensities throughout the kidneys most compatible with cysts. No follow-up imaging recommended. No stones or hydronephrosis. Adrenal glands and urinary bladder unremarkable.  Stomach/Bowel: Retrocecal appendix which is dilated measuring up to 13 mm. Multiple appendicoliths throughout the lumen. Surrounding inflammation. Findings compatible with acute appendicitis. Stomach, large and small bowel grossly unremarkable. Vascular/Lymphatic: No evidence of aneurysm or adenopathy. Scattered aortic calcifications. Reproductive: Uterus and adnexa unremarkable.  No mass. Other: Small amount of free fluid in the pelvis.  No free air. Musculoskeletal: No acute bony  abnormality. IMPRESSION: Dilated, inflamed retrocecal appendix with multiple appendicoliths. Findings compatible with acute appendicitis. Small amount of free fluid in the pelvis. These results were called by telephone at the time of interpretation on 08/08/2023 at 7:10 pm to provider Dr. Theresia Lo, who verbally acknowledged these results. Electronically Signed   By: Charlett Nose M.D.   On: 08/08/2023 19:12      [name] 08/10/2023  Patient ID: Tricia Richards, female   DOB: February 09, 1966, 57 y.o.   MRN: [id number]

## 2023-08-10 NOTE — Discharge Summary (Signed)
    Physician Discharge Summary   Patient ID: Tricia Richards MRN: [id number] DOB/AGE: 1966/05/17 57 y.o.  Admit date: 08/08/2023  Discharge date: 08/10/2023  Discharge Diagnoses:  Principal Problem:   S/P laparoscopic appendectomy   Discharged Condition: good  Hospital Course: Patient was admitted for observation following laparoscopic appendectomy.  Post op course was uncomplicated.  Pain was well controlled.  Tolerated diet.  Patient received IV abx's for 48 hours post op.  Patient was prepared for discharge home on POD#2.  Consults: None  Treatments: surgery: lap appendectomy - Dr. Angelena Form  Discharge Exam: Blood pressure 99/67, pulse 74, temperature 98 F (36.7 C), temperature source Oral, resp. rate 16, last menstrual period 07/12/2012, SpO2 97%. See progress notes.  Disposition: Home  Discharge Instructions     Diet - low sodium heart healthy   Complete by: As directed    Increase activity slowly   Complete by: As directed    No dressing needed   Complete by: As directed       Allergies as of 08/10/2023       Reactions   Bactrim [sulfamethoxazole-trimethoprim] Rash        Medication List     TAKE these medications    acetaminophen 500 MG tablet Commonly known as: TYLENOL Take 1,000 mg by mouth 2 (two) times daily as needed for moderate pain (pain score 4-6) or headache.   amoxicillin-clavulanate 875-125 MG tablet Commonly known as: AUGMENTIN Take 1 tablet by mouth 2 (two) times daily.   eletriptan 40 MG tablet Commonly known as: RELPAX TAKE 1 TABLET BY MOUTH AT ONSET OF HEADACHE. MAY REPEAT AFTER 2 HOURS *MAX 2TABS/24 HOURS**   ibuprofen 200 MG tablet Commonly known as: ADVIL Take 400 mg by mouth 2 (two) times daily as needed for moderate pain (pain score 4-6) or headache.   Metamucil Fiber Chew Chew 1 each by mouth daily in the afternoon.   Probiotic Chew Chew 1 each by mouth daily in the afternoon.   Qulipta 60 MG Tabs Generic  drug: Atogepant TAKE ONE (1) TABLET (60 MG TOTAL) BY MOUTH DAILY. What changed: See the new instructions.   sertraline 50 MG tablet Commonly known as: ZOLOFT Take 75 mg by mouth at bedtime.   SUMAtriptan 6 MG/0.5ML Soaj INJECT 6 MG INTO THE SKIN AS NEEDED. MAY REPEAT IN 2 HOURS. MAXIMUM 2X PER DAY. MAX 2 DAYS A WEEK.   traMADol 50 MG tablet Commonly known as: ULTRAM Take 1 tablet (50 mg total) by mouth every 6 (six) hours as needed for moderate pain (pain score 4-6).               Discharge Care Instructions  (From admission, onward)           Start     Ordered   08/10/23 0000  No dressing needed        08/10/23 1024            Follow-up Information     [facility], [place]. Schedule an appointment as soon as possible for a visit in 2 week(s).   Specialty: General Surgery Why: For wound re-check Contact information: [address] [place] [address] [phone]                [name], MD [facility] Office: [phone]   Signed: [name] 08/10/2023, 10:24 AM

## 2023-08-10 NOTE — Plan of Care (Signed)
  Problem: Education: Goal: Knowledge of General Education information will improve Description Including pain rating scale, medication(s)/side effects and non-pharmacologic comfort measures Outcome: Adequate for Discharge   Problem: Health Behavior/Discharge Planning: Goal: Ability to manage health-related needs will improve Outcome: Adequate for Discharge   

## 2023-08-10 NOTE — Discharge Instructions (Signed)
[facility]  LAPAROSCOPIC SURGERY:  POST-OP INSTRUCTIONS  Always review your discharge instruction sheet given to you by the facility where your surgery was performed.  A prescription for pain medication may be given to you upon discharge.  Take your pain medication as prescribed.  If narcotic pain medicine is not needed, then you may take acetaminophen (Tylenol) or ibuprofen (Advil) as needed.  Take your usually prescribed medications unless otherwise directed.  If you need a refill on your pain medication, please contact your pharmacy.  They will contact our office to request authorization. Prescriptions will not be filled after 5 P.M. or on weekends.  You should follow a light diet the first few days after arrival home, such as soup and crackers or toast.  Be sure to include plenty of fluids daily.  Most patients will experience some swelling and bruising in the area of the incisions.  Ice packs will help.  Swelling and bruising can take several days to resolve.   It is common to experience some constipation after surgery.  Increasing fluid intake and taking a stool softener (such as Colace) will usually help or prevent this problem from occurring.  A mild laxative (Milk of Magnesia or Miralax) should be taken according to package instructions if there has been no bowel movement after 48 hours.  You will likely have Dermabond (topical glue) over your incisions.  This seals the incisions and allows you to bathe and shower at any time after your surgery.  Glue should remain in place for up to 10 days.  It may be removed after 10 days by pealing off the Dermabond material or using Vaseline or naval jelly to remove.  If you have steri-strips over your incisions, you may remove the gauze bandage on the second day after surgery, and you may shower at that time.  Leave your steri-strips (small skin tapes) in place directly over the incision.  These strips should remain on the  skin for 5-7 days and then be removed.  You may get them wet in the shower and pat them dry.  Any sutures or staples will be removed at the office during your follow-up visit.  ACTIVITIES:  You may resume regular (light) daily activities beginning the next day - such as daily self-care, walking, climbing stairs - gradually increasing activities as tolerated.  You may have sexual intercourse when it is comfortable.  Refrain from any heavy lifting or straining until approved by your doctor.  You may drive when you are no longer taking prescription pain medication, when you can comfortably wear a seatbelt, and when you can safely maneuver your car and apply brakes.  You should see your doctor in the office for a follow-up appointment approximately 2-3 weeks after your surgery.  Make sure that you call for this appointment within a day or two after you arrive home to insure a convenient appointment time.  WHEN TO CALL YOUR DOCTOR: Fever over 101.0 Inability to urinate Continued bleeding from incision Increased pain, redness, or drainage from the incision Increasing abdominal pain  The clinic staff is available to answer your questions during regular business hours.  Please don't hesitate to call and ask to speak to one of the nurses for clinical concerns.  If you have a medical emergency, go to the nearest emergency room or call 911.  A surgeon from [facility] is always on call for the hospital.  Shakirah Kirkey, MD [facility] Office: [phone] Toll Free:    [phone] FAX [phone]  Website: [email or web address] 

## 2023-08-10 NOTE — Anesthesia Postprocedure Evaluation (Signed)
Anesthesia Post Note  Patient: Tricia Richards  Procedure(s) Performed: APPENDECTOMY LAPAROSCOPIC (Abdomen)     Patient location during evaluation: PACU Anesthesia Type: General Level of consciousness: awake and alert Pain management: pain level controlled Vital Signs Assessment: post-procedure vital signs reviewed and stable Respiratory status: spontaneous breathing, nonlabored ventilation and respiratory function stable Cardiovascular status: blood pressure returned to baseline and stable Postop Assessment: no apparent nausea or vomiting Anesthetic complications: no   No notable events documented.               Jessah Danser

## 2023-08-12 LAB — SURGICAL PATHOLOGY

## 2023-09-29 ENCOUNTER — Encounter: Payer: Self-pay | Admitting: Neurology

## 2023-09-29 MED ORDER — QULIPTA 60 MG PO TABS: 60.0000 mg | ORAL_TABLET | Freq: Every day | ORAL | 0 refills | Status: DC

## 2023-10-02 ENCOUNTER — Telehealth: Payer: Self-pay | Admitting: *Deleted

## 2023-10-02 NOTE — Telephone Encounter (Signed)
Can we try to get [place] PA with pt's new insurance?

## 2023-10-03 ENCOUNTER — Other Ambulatory Visit (HOSPITAL_COMMUNITY): Payer: Self-pay

## 2023-10-03 NOTE — Telephone Encounter (Signed)
I could not submit on CMM-called insurance-this coverage starts Jan 1st 2025.

## 2023-10-15 ENCOUNTER — Other Ambulatory Visit: Payer: Self-pay | Admitting: Neurology

## 2023-10-20 ENCOUNTER — Telehealth: Payer: Self-pay

## 2023-10-20 ENCOUNTER — Other Ambulatory Visit (HOSPITAL_COMMUNITY): Payer: Self-pay

## 2023-10-20 NOTE — Telephone Encounter (Signed)
 Submitted PA via [facility] with [facility] and there is already an active PA on file with the expiration date of 03/20/2024. I got a successful test claim with a $0/30DS copay for the patient.

## 2023-10-20 NOTE — Telephone Encounter (Signed)
 thanks

## 2024-02-25 ENCOUNTER — Telehealth: Payer: Self-pay

## 2024-02-25 ENCOUNTER — Other Ambulatory Visit (HOSPITAL_COMMUNITY): Payer: Self-pay

## 2024-02-25 NOTE — Telephone Encounter (Signed)
 Pharmacy Patient Advocate Encounter   Received notification from CoverMyMeds that prior authorization for Qulipta  60MG  tablets is required/requested.   Insurance verification completed.   The patient is insured through [employer] .   Per test claim: PA required; PA started via CoverMyMeds. KEY [id number] . Waiting for clinical questions to populate.

## 2024-02-27 ENCOUNTER — Other Ambulatory Visit (HOSPITAL_COMMUNITY): Payer: Self-pay

## 2024-02-27 NOTE — Telephone Encounter (Signed)
 Pharmacy Patient Advocate Encounter  Received notification from [employer] [facility] that Prior Authorization for Qulipta  60MG  tablets has been APPROVED from 02/27/2024 to 02/26/2025. Unable to obtain price due to refill too soon rejection, last fill date 02/11/2024 next available fill date5/22/2025   PA #/Case ID/Reference #: PA Case ID #: [id number]

## 2024-02-27 NOTE — Telephone Encounter (Signed)
 Clinical questions have been answered and PA submitted. PA currently Pending. Please be advised that most companies allow up to 30 days to make a decision. We will advise when a determination has been made, or follow up in 1 week.   Please reach out to our team, Rx Prior Auth Pool, if you haven't heard back in a week.

## 2024-03-10 DIAGNOSIS — Z1231 Encounter for screening mammogram for malignant neoplasm of breast: Secondary | ICD-10-CM | POA: Diagnosis not present

## 2024-03-10 DIAGNOSIS — Z124 Encounter for screening for malignant neoplasm of cervix: Secondary | ICD-10-CM | POA: Diagnosis not present

## 2024-03-10 DIAGNOSIS — N951 Menopausal and female climacteric states: Secondary | ICD-10-CM | POA: Diagnosis not present

## 2024-03-10 DIAGNOSIS — Z130 Encounter for screening for diseases of the blood and blood-forming organs and certain disorders involving the immune mechanism: Secondary | ICD-10-CM | POA: Diagnosis not present

## 2024-03-10 DIAGNOSIS — Z01411 Encounter for gynecological examination (general) (routine) with abnormal findings: Secondary | ICD-10-CM | POA: Diagnosis not present

## 2024-03-10 DIAGNOSIS — Z1151 Encounter for screening for human papillomavirus (HPV): Secondary | ICD-10-CM | POA: Diagnosis not present

## 2024-03-17 DIAGNOSIS — Z131 Encounter for screening for diabetes mellitus: Secondary | ICD-10-CM | POA: Diagnosis not present

## 2024-03-17 DIAGNOSIS — G43909 Migraine, unspecified, not intractable, without status migrainosus: Secondary | ICD-10-CM | POA: Diagnosis not present

## 2024-03-17 DIAGNOSIS — E782 Mixed hyperlipidemia: Secondary | ICD-10-CM | POA: Diagnosis not present

## 2024-03-17 DIAGNOSIS — F419 Anxiety disorder, unspecified: Secondary | ICD-10-CM | POA: Diagnosis not present

## 2024-03-17 DIAGNOSIS — E663 Overweight: Secondary | ICD-10-CM | POA: Diagnosis not present

## 2024-03-17 DIAGNOSIS — Z0000 Encounter for general adult medical examination without abnormal findings: Secondary | ICD-10-CM | POA: Diagnosis not present

## 2024-05-15 ENCOUNTER — Other Ambulatory Visit: Payer: Self-pay | Admitting: Neurology

## 2024-05-18 DIAGNOSIS — E782 Mixed hyperlipidemia: Secondary | ICD-10-CM | POA: Diagnosis not present

## 2024-06-10 DIAGNOSIS — M79644 Pain in right finger(s): Secondary | ICD-10-CM | POA: Diagnosis not present

## 2024-06-21 DIAGNOSIS — I872 Venous insufficiency (chronic) (peripheral): Secondary | ICD-10-CM | POA: Diagnosis not present

## 2024-06-21 DIAGNOSIS — I87393 Chronic venous hypertension (idiopathic) with other complications of bilateral lower extremity: Secondary | ICD-10-CM | POA: Diagnosis not present

## 2024-06-21 DIAGNOSIS — R600 Localized edema: Secondary | ICD-10-CM | POA: Diagnosis not present

## 2024-06-21 DIAGNOSIS — I83893 Varicose veins of bilateral lower extremities with other complications: Secondary | ICD-10-CM | POA: Diagnosis not present

## 2024-06-21 DIAGNOSIS — M79662 Pain in left lower leg: Secondary | ICD-10-CM | POA: Diagnosis not present

## 2024-06-21 DIAGNOSIS — M79661 Pain in right lower leg: Secondary | ICD-10-CM | POA: Diagnosis not present

## 2024-07-05 DIAGNOSIS — M24541 Contracture, right hand: Secondary | ICD-10-CM | POA: Diagnosis not present

## 2024-08-05 DIAGNOSIS — Z23 Encounter for immunization: Secondary | ICD-10-CM | POA: Diagnosis not present

## 2024-08-05 DIAGNOSIS — N3001 Acute cystitis with hematuria: Secondary | ICD-10-CM | POA: Diagnosis not present

## 2024-08-05 DIAGNOSIS — B379 Candidiasis, unspecified: Secondary | ICD-10-CM | POA: Diagnosis not present

## 2024-08-05 DIAGNOSIS — Z6820 Body mass index (BMI) 20.0-20.9, adult: Secondary | ICD-10-CM | POA: Diagnosis not present

## 2024-08-05 DIAGNOSIS — T3695XA Adverse effect of unspecified systemic antibiotic, initial encounter: Secondary | ICD-10-CM | POA: Diagnosis not present

## 2024-08-21 ENCOUNTER — Encounter (HOSPITAL_BASED_OUTPATIENT_CLINIC_OR_DEPARTMENT_OTHER): Payer: Self-pay | Admitting: Emergency Medicine

## 2024-08-21 ENCOUNTER — Emergency Department (HOSPITAL_BASED_OUTPATIENT_CLINIC_OR_DEPARTMENT_OTHER)
Admission: EM | Admit: 2024-08-21 | Discharge: 2024-08-21 | Disposition: A | Discharge status: Home / Self Care | Attending: Emergency Medicine | Admitting: Emergency Medicine

## 2024-08-21 ENCOUNTER — Other Ambulatory Visit: Payer: Self-pay

## 2024-08-21 ENCOUNTER — Emergency Department (HOSPITAL_BASED_OUTPATIENT_CLINIC_OR_DEPARTMENT_OTHER)

## 2024-08-21 DIAGNOSIS — N200 Calculus of kidney: Secondary | ICD-10-CM

## 2024-08-21 DIAGNOSIS — R109 Unspecified abdominal pain: Secondary | ICD-10-CM | POA: Diagnosis not present

## 2024-08-21 DIAGNOSIS — R300 Dysuria: Secondary | ICD-10-CM | POA: Diagnosis present

## 2024-08-21 LAB — COMPREHENSIVE METABOLIC PANEL WITH GFR
ALT: 17 U/L (ref 0–44)
AST: 23 U/L (ref 15–41)
Albumin: 4.3 g/dL (ref 3.5–5.0)
Alkaline Phosphatase: 73 U/L (ref 38–126)
Anion gap: 10 (ref 5–15)
BUN: 12 mg/dL (ref 6–20)
CO2: 27 mmol/L (ref 22–32)
Calcium: 10 mg/dL (ref 8.9–10.3)
Chloride: 103 mmol/L (ref 98–111)
Creatinine, Ser: 0.93 mg/dL (ref 0.44–1.00)
GFR, Estimated: >60 mL/min (ref >60)
Glucose, Bld: 74 mg/dL (ref 70–99)
Potassium: 3.6 mmol/L (ref 3.5–5.1)
Sodium: 140 mmol/L (ref 135–145)
Total Bilirubin: 0.3 mg/dL (ref 0.0–1.2)
Total Protein: 6.8 g/dL (ref 6.5–8.1)

## 2024-08-21 LAB — CBC
HCT: 40.5 % (ref 36.0–46.0)
Hemoglobin: 13.4 g/dL (ref 12.0–15.0)
MCH: 29.9 pg (ref 26.0–34.0)
MCHC: 33.1 g/dL (ref 30.0–36.0)
MCV: 90.4 fL (ref 80.0–100.0)
Platelets: 230 K/uL (ref 150–400)
RBC: 4.48 MIL/uL (ref 3.87–5.11)
RDW: 12.9 % (ref 11.5–15.5)
WBC: 5.6 K/uL (ref 4.0–10.5)
nRBC: 0 % (ref 0.0–0.2)

## 2024-08-21 LAB — URINALYSIS, ROUTINE W REFLEX MICROSCOPIC
Bilirubin Urine: NEGATIVE
Glucose, UA: NEGATIVE mg/dL
Hgb urine dipstick: NEGATIVE
Ketones, ur: NEGATIVE mg/dL
Leukocytes,Ua: NEGATIVE
Nitrite: NEGATIVE
Protein, ur: NEGATIVE mg/dL
Specific Gravity, Urine: <1.005 — ABNORMAL LOW (ref 1.005–1.030)
pH: 6.5 (ref 5.0–8.0)

## 2024-08-21 LAB — LIPASE, BLOOD: Lipase: 58 U/L — ABNORMAL HIGH (ref 11–51)

## 2024-08-21 MED ORDER — ONDANSETRON HCL 4 MG PO TABS: 4.0000 mg | ORAL_TABLET | Freq: Three times a day (TID) | ORAL | 0 refills | Status: AC | PRN

## 2024-08-21 MED ORDER — KETOROLAC TROMETHAMINE 10 MG PO TABS: 10.0000 mg | ORAL_TABLET | Freq: Four times a day (QID) | ORAL | 0 refills | Status: AC | PRN

## 2024-08-21 MED ORDER — TAMSULOSIN HCL 0.4 MG PO CAPS: 0.4000 mg | ORAL_CAPSULE | Freq: Two times a day (BID) | ORAL | 0 refills | Status: AC

## 2024-08-21 MED ORDER — KETOROLAC TROMETHAMINE 60 MG/2ML IM SOLN
60.0000 mg | Freq: Once | INTRAMUSCULAR | Status: AC
  Administered 2024-08-21: 60 mg via INTRAMUSCULAR
  Filled 2024-08-21: qty 2

## 2024-08-21 NOTE — ED Notes (Signed)
 Discharge instructions reviewed with patient. Patient questions answered and opportunity for education reviewed. Patient voices understanding of discharge instructions with no further questions. Patient ambulatory with steady gait to lobby.

## 2024-08-21 NOTE — ED Triage Notes (Signed)
 Pt reports pelvic pain radiating to back, difficulty urinating since the 23rd. Seen multiple times for UTI workup with negative results.

## 2024-08-21 NOTE — Discharge Instructions (Signed)
 Your hematuria (blood in your urine) appears to be caused by a kidney stone which is sitting at the junction between your ureter which is the tube that connects your kidney to your bladder and the bladder itself.  It is 4X 2 mm and should be able to pass this easily. Discharging you with pain and nausea medications as well as a Flomax which is a medication that helps to dilate your ureter to help you pass the stone.  Return to the ED immediately if you develop fever, uncontrolled pain or vomiting, or other concerns.

## 2024-08-21 NOTE — ED Provider Notes (Signed)
 [place] EMERGENCY DEPARTMENT AT [facility] Provider Note   CSN: [id number] Arrival date & time: 08/21/24  1542     Patient presents with: Dysuria and Abdominal Pain   Tricia Richards is a 58 y.o. female who presents emergency department for evaluation of hematuria and flank pain.  Patient reports that being 1022 she began having episodes of urinary urgency and frequency.  She noted some blood in her urine and [facility] treated for urinary tract infection but called later because her urine culture was negative.  She completed a course of Macrobid.  She went in to see her PCP for follow-up given her persistent symptoms.  They ran a urinalysis and a culture which were all negative.  Patient states that she continued to have symptoms and then yesterday had episodes of gross hematuria with bloody urine followed by relief of the symptoms she was having of urinary urgency but then began having a sensation of fullness in her bladder along with right    Dysuria Associated symptoms: abdominal pain   Abdominal Pain Associated symptoms: dysuria        Prior to Admission medications   Medication Sig Start Date End Date Taking? Authorizing Provider  acetaminophen  (TYLENOL ) 500 MG tablet Take 1,000 mg by mouth 2 (two) times daily as needed for moderate pain (pain score 4-6) or headache.    [provider]  amoxicillin -clavulanate (AUGMENTIN ) 875-125 MG tablet Take 1 tablet by mouth 2 (two) times daily. 08/10/23   Eletha Boas, MD  Atogepant  (QULIPTA ) 60 MG TABS Take 1 tablet (60 mg total) by mouth daily. 09/29/23   Ines Onetha NOVAK, MD  eletriptan  (RELPAX ) 40 MG tablet TAKE 1 TABLET BY MOUTH AT ONSET OF HEADACHE. MAY REPEAT AFTER 2 HOURS *MAX 2TABS/24 HOURS** 05/18/24   Sater, Charlie LABOR, MD  ibuprofen  (ADVIL ) 200 MG tablet Take 400 mg by mouth 2 (two) times daily as needed for moderate pain (pain score 4-6) or headache.    [provider]  Metamucil Fiber  CHEW Chew 1 each by mouth daily in the afternoon.    [provider]  Probiotic CHEW Chew 1 each by mouth daily in the afternoon.    [provider]  QULIPTA  60 MG TABS TAKE ONE (1) TABLET (60 MG TOTAL) BY MOUTH DAILY. Patient taking differently: Take 60 mg by mouth at bedtime. 06/25/23   Ines Onetha NOVAK, MD  sertraline  (ZOLOFT ) 50 MG tablet Take 75 mg by mouth at bedtime. 09/01/19   [provider]  SUMAtriptan  6 MG/0.5ML SOAJ INJECT 6 MG INTO THE SKIN AS NEEDED. MAY REPEAT IN 2 HOURS. MAXIMUM 2X PER DAY. MAX 2 DAYS A WEEK. 10/16/23   Ines Onetha NOVAK, MD  traMADol  (ULTRAM ) 50 MG tablet Take 1 tablet (50 mg total) by mouth every 6 (six) hours as needed for moderate pain (pain score 4-6). 08/10/23   Eletha Boas, MD    Allergies: Bactrim [sulfamethoxazole-trimethoprim]    Review of Systems  Gastrointestinal:  Positive for abdominal pain.  Genitourinary:  Positive for dysuria.    Updated Vital Signs BP 135/85   Pulse 84   Temp 98.4 F (36.9 C) (Oral)   Resp 14   Ht 5' 1 (1.549 m)   Wt 52.2 kg   LMP 07/12/2012   SpO2 100%   BMI 21.73 kg/m   Physical Exam  (all labs ordered are listed, but only abnormal results are displayed) Labs Reviewed  LIPASE, BLOOD - Abnormal; Notable for  the following components:      Result Value   Lipase 58 (*)    All other components within normal limits  URINALYSIS, ROUTINE W REFLEX MICROSCOPIC - Abnormal; Notable for the following components:   Color, Urine COLORLESS (*)    Specific Gravity, Urine <1.005 (*)    All other components within normal limits  COMPREHENSIVE METABOLIC PANEL WITH GFR  CBC    EKG: None  Radiology: No results found.   Procedures   Medications Ordered in the ED - No data to display  Clinical Course as of 08/21/24 1815  Sat Aug 21, 2024  1802 CT Renal Bethena Rear [AH]    Clinical Course User Index [AH] Arloa Chroman, PA-C                                 Medical Decision  Making Given the large differential diagnosis for HYE TRAWICK, the decision making in this case is of high complexity.  The presentation NOT consistent with an infected stone, nephric abscess, sepsis, or renal failure.  Similarly, this presentation is NOT consistent with AAA; Mesenteric Ischemia; Bowel Perforation; Bowel Obstruction; Sigmoid Volvulus; Diverticulitis; Appendicitis; Peritonitis; Cholecystitis, ascending cholangitis or other gallbladder disease; perforated ulcer; significant GI bleeding, splenic rupture/infarction; Hepatic abscess; or other surgical/acute abdomen.  Similarly, this presentation is NOT consistent with ACS or Myocardial Ischemia; Pulmonary Embolism; fistula; incarcerated hernia; Pancreatitis, Aortic Dissection; Diabetic Ketoacidosis; Ischemic colitis; Psoas or other abscess; Methanol poisoning; Heavy metal toxicity; or porphyria.  Similarly, this case is NOT consistent with Fitz-Hugh-Curtis Syndrome, Ectopic Pregnancy, Placental Abruption, PID, Tubo-ovarian abscess, Ovarian Torsion, or STI.  Similarly, this presentation is NOT consistent with acute coronary syndrome, pulmonary embolism, dissection, borhaave's, arrythmia, pneumothorax, cardiac tamponade, or other emergent cardiopulmonary condition.  Similarly, this presentation is NOT consistent with pyelonephritis, urinary infection, pneumonia, or other focal bacterial infection.  Strict return and follow-up precautions have been given by me personally or by detailed written instruction verbalized by nursing staff using the teach back method. to the patient/family/caregiver(s).  Data Reviewed/Counseling: I have reviwed the patient's vital signs, nursing notes, and other relevant tests/information. I had a detailed discussion regarding the historical points, exam findings, and any diagnostic results supporting the discharge diagnosis. I also discussed the need for outpatient follow-up and the need to return to the ED  if symptoms worsen or if there are any questions or concerns that arise at home.    Amount and/or Complexity of Data Reviewed Labs: ordered.    Details: No sig findings, no urinary infection Radiology: ordered and independent interpretation performed. Decision-making details documented in ED Course.    Details: CT renal stone shows acute 4x 2mm stone R uvj  Risk Prescription drug management.        Final diagnoses:  None    ED Discharge Orders     None          Arloa Chroman, PA-C 08/30/24 1213    Emil Share, [place] 08/31/24 [phone]

## 2024-08-21 NOTE — ED Notes (Signed)
 Patient transported to CT

## 2024-09-01 DIAGNOSIS — R252 Cramp and spasm: Secondary | ICD-10-CM | POA: Diagnosis not present

## 2024-09-01 DIAGNOSIS — G2581 Restless legs syndrome: Secondary | ICD-10-CM | POA: Diagnosis not present

## 2024-09-01 DIAGNOSIS — R600 Localized edema: Secondary | ICD-10-CM | POA: Diagnosis not present

## 2024-09-01 DIAGNOSIS — I872 Venous insufficiency (chronic) (peripheral): Secondary | ICD-10-CM | POA: Diagnosis not present

## 2024-09-02 ENCOUNTER — Ambulatory Visit: Admitting: Urology

## 2024-09-02 ENCOUNTER — Encounter: Payer: Self-pay | Admitting: Urology

## 2024-09-02 ENCOUNTER — Ambulatory Visit (INDEPENDENT_AMBULATORY_CARE_PROVIDER_SITE_OTHER): Admitting: Urology

## 2024-09-02 ENCOUNTER — Ambulatory Visit (HOSPITAL_BASED_OUTPATIENT_CLINIC_OR_DEPARTMENT_OTHER)
Admission: RE | Admit: 2024-09-02 | Discharge: 2024-09-02 | Disposition: A | Discharge status: Home / Self Care | Source: Ambulatory Visit | Attending: Urology | Admitting: Urology

## 2024-09-02 VITALS — BP 109/75 | HR 82 | Ht 61.0 in | Wt 116.0 lb

## 2024-09-02 DIAGNOSIS — N201 Calculus of ureter: Secondary | ICD-10-CM | POA: Insufficient documentation

## 2024-09-02 LAB — URINALYSIS, ROUTINE W REFLEX MICROSCOPIC
Glucose, UA: NEGATIVE
Leukocytes,UA: NEGATIVE
Nitrite, UA: NEGATIVE
Protein,UA: NEGATIVE
RBC, UA: NEGATIVE
Specific Gravity, UA: 1.02 (ref 1.005–1.030)
Urobilinogen, Ur: 0.2 mg/dL (ref 0.2–1.0)
pH, UA: 6.5 (ref 5.0–7.5)

## 2024-09-02 LAB — MICROSCOPIC EXAMINATION

## 2024-09-02 NOTE — Progress Notes (Signed)
 Assessment: 1. Ureteral calculus, right     Plan: I personally reviewed the patient's chart including provider notes, lab and imaging results. I personally reviewed the CT study from 08/21/2024 with results as noted below. I personally reviewed the KUB study from today.  Given the stone size and location, I think there is a very high probability of spontaneous passage. Recommend continuing tamsulosin , strain urine. Return to office in 10-14 days.  Chief Complaint:  Chief Complaint  Patient presents with   Nephrolithiasis    History of Present Illness:  Tricia Richards is a 58 y.o. female who is seen in consultation from Mliss Chill, [place] for evaluation of a right ureteral calculus. She was seen in the emergency room on 08/21/2024 with flank pain and hematuria. Urinalysis was negative. Creatinine 0.93. WBC 5.6 CT urogram showed a 4 x 2 mm calculus at the right UVJ with mild right sided hydronephrosis; no other stones visualized. Her flank pain resolved approximately 24 hours after her ER visit.  She has had some intermittent urgency occurring every 2-3 days.  No further gross hematuria.  No fevers or chills.  She is not aware of passing a stone but was not straining her urine.  She has a prior history of nephrolithiasis approximately 25 years ago.  Past Medical History:  Past Medical History:  Diagnosis Date   Brain tumor [facility])    acoustic neuroma, benign. Left   Cataracts, bilateral    Deafness    left   Kidney stones    Migraine    Migraine headache without aura    Migraine with aura     Past Surgical History:  Past Surgical History:  Procedure Laterality Date   ACOUSTIC NEUROMA RESECTION  11/2001   LAPAROSCOPIC APPENDECTOMY N/A 08/08/2023   Procedure: APPENDECTOMY LAPAROSCOPIC;  Surgeon: Teresa Lonni HERO, MD;  Location: [facility] OR;  Service: General;  Laterality: N/A;   tummy tuck      Allergies:  Allergies  Allergen Reactions   Bactrim  [Sulfamethoxazole-Trimethoprim] Rash    Family History:  Family History  Problem Relation Age of Onset   Depression Mother    Diabetes Father    Hypertension Father    Heart disease Father    Migraines Neg Hx     Social History:  Social History   Tobacco Use   Smoking status: Former    Current packs/day: 0.00    Types: Cigarettes    Quit date: 1995    Years since quitting: 30.9   Smokeless tobacco: Never   Tobacco comments:    1 pack a week for 5-6 years  Vaping Use   Vaping status: Never Used  Substance Use Topics   Alcohol use: Yes    Comment: occasionally    Drug use: Never    Review of symptoms:  Constitutional:  Negative for unexplained weight loss, night sweats, fever, chills ENT:  Negative for nose bleeds, sinus pain, painful swallowing CV:  Negative for chest pain, shortness of breath, exercise intolerance, palpitations, loss of consciousness Resp:  Negative for cough, wheezing, shortness of breath GI:  Negative for nausea, vomiting, diarrhea, bloody stools GU:  Positives noted in HPI; otherwise negative for gross hematuria, dysuria, urinary incontinence Neuro:  Negative for seizures, poor balance, limb weakness, slurred speech Psych:  Negative for lack of energy, depression, anxiety Endocrine:  Negative for polydipsia, polyuria, symptoms of hypoglycemia (dizziness, hunger, sweating) Hematologic:  Negative for anemia, purpura, petechia, prolonged or excessive bleeding, use of anticoagulants  Allergic:  Negative for difficulty breathing or choking as a result of exposure to anything; no shellfish allergy; no allergic response (rash/itch) to materials, foods  Physical exam: BP 109/75   Pulse 82   Ht 5' 1 (1.549 m)   Wt 116 lb (52.6 kg)   LMP 07/12/2012   BMI 21.92 kg/m  GENERAL APPEARANCE:  Well appearing, well developed, well nourished, NAD HEENT: Atraumatic, Normocephalic, oropharynx clear. NECK: Supple without lymphadenopathy or thyromegaly. LUNGS:  Clear to auscultation bilaterally. HEART: Regular Rate and Rhythm without murmurs, gallops, or rubs. ABDOMEN: Soft, non-tender, No Masses. EXTREMITIES: Moves all extremities well.  Without clubbing, cyanosis, or edema. NEUROLOGIC:  Alert and oriented x 3, normal gait, CN II-XII grossly intact.  MENTAL STATUS:  Appropriate. BACK:  Non-tender to palpation.  No CVAT SKIN:  Warm, dry and intact.    Results: U/A: 0-5 WBCs, 0-2 RBCs, calcium oxalate crystals  KUB:  2-3 mm calcification seen to right of coccyx consistent with distal ureteral calculus.

## 2024-09-15 ENCOUNTER — Ambulatory Visit: Admitting: Urology

## 2024-09-22 ENCOUNTER — Ambulatory Visit: Admitting: Urology

## 2024-10-21 ENCOUNTER — Other Ambulatory Visit: Payer: Self-pay | Admitting: Neurology

## 2024-10-21 MED ORDER — QULIPTA 60 MG PO TABS: ORAL_TABLET | ORAL | 0 refills | Status: AC

## 2024-10-21 NOTE — Telephone Encounter (Signed)
 Pt called and was assigned a new provider. Pt is needing a refill on her QULIPTA  60 MG TABS and it is to be sent to the [facility].

## 2024-10-21 NOTE — Telephone Encounter (Signed)
 Last seen on 06/25/23 Follow up scheduled on 12/07/24  Former Dr. Ines patient  Rx pending

## 2024-11-02 ENCOUNTER — Telehealth: Payer: Self-pay | Admitting: *Deleted

## 2024-11-02 NOTE — Telephone Encounter (Signed)
 Pt has called to f/u on this refill request, she said she has been informed that a PA is needed for the  QULIPTA  60 MG TABS  but has not heard anything about that being done, please advise.

## 2024-11-02 NOTE — Telephone Encounter (Signed)
 SABRA

## 2024-11-03 ENCOUNTER — Other Ambulatory Visit (HOSPITAL_COMMUNITY): Payer: Self-pay

## 2024-11-03 ENCOUNTER — Telehealth: Payer: Self-pay

## 2024-11-03 NOTE — Telephone Encounter (Signed)
 Pharmacy Patient Advocate Encounter   Received notification from Pt Calls Messages that prior authorization for Qulipta  60MG  tablets is required/requested.   Insurance verification completed.   The patient is insured through [employer].   Per test claim: Insurance requires patient have appointment. Message sent to patient.

## 2024-11-10 ENCOUNTER — Other Ambulatory Visit (HOSPITAL_COMMUNITY): Payer: Self-pay

## 2024-11-10 ENCOUNTER — Telehealth: Payer: Self-pay | Admitting: Pharmacy Technician

## 2024-11-10 NOTE — Telephone Encounter (Signed)
 Pharmacy Patient Advocate Encounter   Received notification from Pt Calls Messages that prior authorization for QULIPTA  60MG  is required/requested.   Insurance verification completed.   The patient is insured through [employer].   Per test claim: PA required; PA submitted to above mentioned insurance via Latent Key/confirmation #/EOC [facility] Status is pending

## 2024-11-10 NOTE — Telephone Encounter (Signed)
 Pharmacy Patient Advocate Encounter  Received notification from [facility] that Prior Authorization for QULIPTA  60MG  has been APPROVED from 12.29.25 to 1.28.27. Ran test claim, Copay is $0. This test claim was processed through [facility]- copay amounts may vary at other pharmacies due to pharmacy/plan contracts, or as the patient moves through the different stages of their insurance plan.   PA #/Case ID/Reference #: [id number]

## 2024-11-10 NOTE — Telephone Encounter (Signed)
 PA has been submitted, and telephone encounter has been created. Please see telephone encounter dated 1.28.26.

## 2024-11-10 NOTE — Telephone Encounter (Signed)
 Sent mychart message to pt.  Prescription was sent to [facility] 10-21-2024 #30.

## 2024-12-09 ENCOUNTER — Ambulatory Visit: Admitting: Neurology
